# Patient Record
Sex: Female | Born: 1990 | Race: Black or African American | Hispanic: No | Marital: Single | Smoking: Former smoker
Health system: Southern US, Community
[De-identification: ages and names within clinical notes are randomized; demographics above are authoritative.]

## PROBLEM LIST (undated history)

## (undated) ENCOUNTER — Inpatient Hospital Stay (HOSPITAL_COMMUNITY): Payer: Self-pay

## (undated) DIAGNOSIS — R519 Headache, unspecified: Secondary | ICD-10-CM

## (undated) DIAGNOSIS — Z8619 Personal history of other infectious and parasitic diseases: Secondary | ICD-10-CM

## (undated) DIAGNOSIS — I499 Cardiac arrhythmia, unspecified: Secondary | ICD-10-CM

## (undated) DIAGNOSIS — D649 Anemia, unspecified: Secondary | ICD-10-CM

## (undated) DIAGNOSIS — J45909 Unspecified asthma, uncomplicated: Secondary | ICD-10-CM

## (undated) DIAGNOSIS — F419 Anxiety disorder, unspecified: Secondary | ICD-10-CM

## (undated) HISTORY — DX: Cardiac arrhythmia, unspecified: I49.9

## (undated) HISTORY — PX: NO PAST SURGERIES: SHX2092

## (undated) HISTORY — DX: Personal history of other infectious and parasitic diseases: Z86.19

## (undated) HISTORY — DX: Headache, unspecified: R51.9

## (undated) HISTORY — DX: Anxiety disorder, unspecified: F41.9

---
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
## 2010-07-24 ENCOUNTER — Emergency Department (HOSPITAL_COMMUNITY)
Admission: EM | Admit: 2010-07-24 | Discharge: 2010-07-25 | Disposition: A | Discharge status: Other Acute Inpatient Hospital | Payer: Self-pay | Source: Home / Self Care | Admitting: Emergency Medicine

## 2010-09-27 ENCOUNTER — Emergency Department (HOSPITAL_COMMUNITY)
Admission: EM | Admit: 2010-09-27 | Discharge: 2010-09-27 | Disposition: A | Discharge status: Home / Self Care | Payer: No Typology Code available for payment source | Attending: Emergency Medicine | Admitting: Emergency Medicine

## 2010-09-27 DIAGNOSIS — R079 Chest pain, unspecified: Secondary | ICD-10-CM | POA: Insufficient documentation

## 2010-09-27 DIAGNOSIS — Y92410 Unspecified street and highway as the place of occurrence of the external cause: Secondary | ICD-10-CM | POA: Insufficient documentation

## 2010-09-27 DIAGNOSIS — M25569 Pain in unspecified knee: Secondary | ICD-10-CM | POA: Insufficient documentation

## 2010-09-27 DIAGNOSIS — T1490XA Injury, unspecified, initial encounter: Secondary | ICD-10-CM | POA: Insufficient documentation

## 2010-10-04 ENCOUNTER — Emergency Department (HOSPITAL_COMMUNITY)
Admission: EM | Admit: 2010-10-04 | Discharge: 2010-10-04 | Disposition: A | Discharge status: Home / Self Care | Payer: No Typology Code available for payment source | Attending: Emergency Medicine | Admitting: Emergency Medicine

## 2010-10-04 DIAGNOSIS — M542 Cervicalgia: Secondary | ICD-10-CM | POA: Insufficient documentation

## 2010-10-04 DIAGNOSIS — M25569 Pain in unspecified knee: Secondary | ICD-10-CM | POA: Insufficient documentation

## 2010-10-26 ENCOUNTER — Emergency Department: Payer: Self-pay | Admitting: Internal Medicine

## 2010-10-29 LAB — POCT PREGNANCY, URINE: Preg Test, Ur: NEGATIVE

## 2011-01-04 ENCOUNTER — Ambulatory Visit: Payer: Self-pay | Admitting: Internal Medicine

## 2011-05-21 LAB — D-DIMER, QUANTITATIVE: D-Dimer, Quant: 0.23

## 2013-06-14 LAB — OB RESULTS CONSOLE ABO/RH: RH Type: POSITIVE

## 2013-06-14 LAB — OB RESULTS CONSOLE GC/CHLAMYDIA
Chlamydia: NEGATIVE
Gonorrhea: NEGATIVE

## 2013-06-14 LAB — OB RESULTS CONSOLE HEPATITIS B SURFACE ANTIGEN: Hepatitis B Surface Ag: NEGATIVE

## 2013-06-14 LAB — OB RESULTS CONSOLE ANTIBODY SCREEN: Antibody Screen: NEGATIVE

## 2013-06-14 LAB — OB RESULTS CONSOLE HIV ANTIBODY (ROUTINE TESTING): HIV: NONREACTIVE

## 2013-06-14 LAB — OB RESULTS CONSOLE RUBELLA ANTIBODY, IGM: Rubella: IMMUNE

## 2013-06-14 LAB — OB RESULTS CONSOLE RPR: RPR: NONREACTIVE

## 2013-08-18 NOTE — L&D Delivery Note (Signed)
Delivery Note  Cervix complete at about 0430, and pt began pushing, FHR w variable decels, overall reassuring, pt pushing well and vtx descending    At 5:03 AM a viable female was delivered via Vaginal, Spontaneous Delivery (Presentation: ; Occiput Anterior).  Shoulders delivered easily, cord around foot, APGAR: 8, 9; weight 7 lb 5.8 oz (3340 g).   Placenta status: Intact, Spontaneous.  Cord: 3 vessels with the following complications: None.  Cord pH: not collected  Cord blood collected for donation, and routine collection  Dr Raphael Gibney called to bs to evaluate for possible cervical laceration, and cervix found to be intact,   Anesthesia: Epidural  Episiotomy: None Lacerations: 2nd degree Suture Repair: 3.0 vicryl 4-0 monocryl  Est. Blood Loss (mL): 400cc   Mom to postpartum.  Baby to Couplet care / Skin to Skin. Pt plans to BF Plans outpatient circumcision   Dawn Zamora 01/07/2014, 6:04 AM

## 2013-10-19 ENCOUNTER — Other Ambulatory Visit: Payer: Self-pay

## 2013-10-19 ENCOUNTER — Inpatient Hospital Stay (HOSPITAL_COMMUNITY)
Admission: AD | Admit: 2013-10-19 | Discharge: 2013-10-19 | Disposition: A | Discharge status: Home / Self Care | Payer: Medicaid Other | Source: Ambulatory Visit | Attending: Obstetrics and Gynecology | Admitting: Obstetrics and Gynecology

## 2013-10-19 ENCOUNTER — Encounter (HOSPITAL_COMMUNITY): Payer: Self-pay | Admitting: *Deleted

## 2013-10-19 DIAGNOSIS — O99891 Other specified diseases and conditions complicating pregnancy: Secondary | ICD-10-CM | POA: Insufficient documentation

## 2013-10-19 DIAGNOSIS — J45909 Unspecified asthma, uncomplicated: Secondary | ICD-10-CM | POA: Insufficient documentation

## 2013-10-19 DIAGNOSIS — O26879 Cervical shortening, unspecified trimester: Secondary | ICD-10-CM | POA: Insufficient documentation

## 2013-10-19 DIAGNOSIS — O9989 Other specified diseases and conditions complicating pregnancy, childbirth and the puerperium: Secondary | ICD-10-CM

## 2013-10-19 HISTORY — DX: Unspecified asthma, uncomplicated: J45.909

## 2013-10-19 MED ORDER — BETAMETHASONE SOD PHOS & ACET 6 (3-3) MG/ML IJ SUSP
12.0000 mg | Freq: Once | INTRAMUSCULAR | Status: AC
  Administered 2013-10-19: 12 mg via INTRAMUSCULAR
  Filled 2013-10-19: qty 2

## 2013-10-19 NOTE — MAU Note (Signed)
Dawn Zamora is a 23 y.o. female G1P0 sent from the office for shortened cervix on ultrasound at 29+ 1 weeks. Patient reports good fetal movement. Denies any contraction, LOF or bleeding.  Ultrasound today: EFW  2 lbs  15 oz  51%, pelvic kidney x1, normal AFI, cervix=1.39 cm  Her pregnancy is remarkable for:  1. Transfer of care from [facility] at 27 weeks due to relocation 2. Shorter cervix measuring 2.3 cm for which patient has been using Prometrium 200 mg per vagina HS 3. Asthma - well-controlled  History OB History   Grav Para Term Preterm Abortions TAB SAB Ect Mult Living   3 0   1 1    0     Past Medical History  Diagnosis Date  . Asthma    Past Surgical History  Procedure Laterality Date  . No past surgeries        Blood pressure 105/62, pulse 75, resp. rate 16.  General Appearance: Alert, appropriate appearance for age. No acute distress HEENT Exam: Grossly normal Psychiatric Exam: Alert and oriented, appropriate affect  Fetal monitoring: reviewed and reassuring Category 1. No contractions  ++++++++++++++++++++++++++++++++++++++++++++++++++++++++++++++++  Vaginal exam: deferred  ++++++++++++++++++++++++++++++++++++++++++++++++++++++++++++++++  Assessment and plan:  29+1 weeks with shortened cervix BMZ #1 received No evidence of preterm labor D/C home and return tomorrow for BMZ #2 PTL precautions given. Bed rest recommended and explained.  Next appointment in office: 1 week with repeat ultrasound  Delsa Bern MD

## 2013-10-19 NOTE — Discharge Instructions (Signed)
Keep your scheduled appointment for prenatal care. Return to MAU tomorrow for second injection. Dr. Cletis Media advises that you do not go to [place] this weekend and that you remain on bedrest.

## 2013-10-19 NOTE — MAU Note (Signed)
Waiting on Betamethasone from pharmcy- Dr Cletis Media called and wanted pt to be monitored

## 2013-10-20 ENCOUNTER — Inpatient Hospital Stay (HOSPITAL_COMMUNITY)
Admission: AD | Admit: 2013-10-20 | Discharge: 2013-10-20 | Disposition: A | Discharge status: Home / Self Care | Payer: Medicaid Other | Source: Ambulatory Visit | Attending: Obstetrics and Gynecology | Admitting: Obstetrics and Gynecology

## 2013-10-20 DIAGNOSIS — O4700 False labor before 37 completed weeks of gestation, unspecified trimester: Secondary | ICD-10-CM | POA: Insufficient documentation

## 2013-10-20 MED ORDER — BETAMETHASONE SOD PHOS & ACET 6 (3-3) MG/ML IJ SUSP
12.0000 mg | Freq: Once | INTRAMUSCULAR | Status: AC
  Administered 2013-10-20: 12 mg via INTRAMUSCULAR
  Filled 2013-10-20: qty 2

## 2013-12-08 LAB — OB RESULTS CONSOLE GBS: GBS: POSITIVE

## 2014-01-06 ENCOUNTER — Telehealth (HOSPITAL_COMMUNITY): Payer: Self-pay | Admitting: *Deleted

## 2014-01-06 ENCOUNTER — Inpatient Hospital Stay (HOSPITAL_COMMUNITY)
Admission: AD | Admit: 2014-01-06 | Discharge: 2014-01-09 | DRG: 775 | Disposition: A | Discharge status: Home / Self Care | Payer: Medicaid Other | Source: Ambulatory Visit | Attending: Obstetrics and Gynecology | Admitting: Obstetrics and Gynecology

## 2014-01-06 ENCOUNTER — Encounter (HOSPITAL_COMMUNITY): Payer: Self-pay | Admitting: *Deleted

## 2014-01-06 DIAGNOSIS — F411 Generalized anxiety disorder: Secondary | ICD-10-CM | POA: Diagnosis present

## 2014-01-06 DIAGNOSIS — D649 Anemia, unspecified: Secondary | ICD-10-CM | POA: Diagnosis present

## 2014-01-06 DIAGNOSIS — Z87891 Personal history of nicotine dependence: Secondary | ICD-10-CM

## 2014-01-06 DIAGNOSIS — Z22330 Carrier of Group B streptococcus: Secondary | ICD-10-CM

## 2014-01-06 DIAGNOSIS — J45909 Unspecified asthma, uncomplicated: Secondary | ICD-10-CM | POA: Diagnosis present

## 2014-01-06 DIAGNOSIS — O26879 Cervical shortening, unspecified trimester: Secondary | ICD-10-CM | POA: Diagnosis present

## 2014-01-06 DIAGNOSIS — Z833 Family history of diabetes mellitus: Secondary | ICD-10-CM

## 2014-01-06 DIAGNOSIS — O4290 Premature rupture of membranes, unspecified as to length of time between rupture and onset of labor, unspecified weeks of gestation: Principal | ICD-10-CM | POA: Diagnosis present

## 2014-01-06 DIAGNOSIS — O99344 Other mental disorders complicating childbirth: Secondary | ICD-10-CM | POA: Diagnosis present

## 2014-01-06 DIAGNOSIS — O9989 Other specified diseases and conditions complicating pregnancy, childbirth and the puerperium: Secondary | ICD-10-CM

## 2014-01-06 DIAGNOSIS — O99892 Other specified diseases and conditions complicating childbirth: Secondary | ICD-10-CM | POA: Diagnosis present

## 2014-01-06 DIAGNOSIS — O9902 Anemia complicating childbirth: Secondary | ICD-10-CM | POA: Diagnosis present

## 2014-01-06 LAB — POCT FERN TEST: POCT Fern Test: POSITIVE

## 2014-01-06 LAB — RPR

## 2014-01-06 LAB — CBC
HCT: 34.2 % — ABNORMAL LOW (ref 36.0–46.0)
Hemoglobin: 11.5 g/dL — ABNORMAL LOW (ref 12.0–15.0)
MCH: 27.7 pg (ref 26.0–34.0)
MCHC: 33.6 g/dL (ref 30.0–36.0)
MCV: 82.4 fL (ref 78.0–100.0)
Platelets: 281 10*3/uL (ref 150–400)
RBC: 4.15 MIL/uL (ref 3.87–5.11)
RDW: 13.6 % (ref 11.5–15.5)
WBC: 9.3 10*3/uL (ref 4.0–10.5)

## 2014-01-06 MED ORDER — OXYTOCIN BOLUS FROM INFUSION
500.0000 mL | INTRAVENOUS | Status: DC
  Administered 2014-01-07: 500 mL via INTRAVENOUS

## 2014-01-06 MED ORDER — LIDOCAINE HCL (PF) 1 % IJ SOLN
30.0000 mL | INTRAMUSCULAR | Status: DC | PRN
  Filled 2014-01-06: qty 30

## 2014-01-06 MED ORDER — CITRIC ACID-SODIUM CITRATE 334-500 MG/5ML PO SOLN: 30.0000 mL | ORAL | Status: DC | PRN

## 2014-01-06 MED ORDER — LACTATED RINGERS IV SOLN
INTRAVENOUS | Status: DC
  Administered 2014-01-06 – 2014-01-07 (×2): via INTRAVENOUS

## 2014-01-06 MED ORDER — OXYTOCIN 40 UNITS IN LACTATED RINGERS INFUSION - SIMPLE MED
1.0000 m[IU]/min | INTRAVENOUS | Status: DC
  Administered 2014-01-06: 1 m[IU]/min via INTRAVENOUS
  Filled 2014-01-06: qty 1000

## 2014-01-06 MED ORDER — NALBUPHINE HCL 10 MG/ML IJ SOLN
10.0000 mg | INTRAMUSCULAR | Status: DC | PRN
  Administered 2014-01-07: 10 mg via INTRAVENOUS
  Filled 2014-01-06: qty 1

## 2014-01-06 MED ORDER — OXYCODONE-ACETAMINOPHEN 5-325 MG PO TABS: 1.0000 | ORAL_TABLET | ORAL | Status: DC | PRN

## 2014-01-06 MED ORDER — ACETAMINOPHEN 325 MG PO TABS: 650.0000 mg | ORAL_TABLET | ORAL | Status: DC | PRN

## 2014-01-06 MED ORDER — IBUPROFEN 600 MG PO TABS
600.0000 mg | ORAL_TABLET | Freq: Four times a day (QID) | ORAL | Status: DC | PRN
  Administered 2014-01-07: 600 mg via ORAL
  Filled 2014-01-06: qty 1

## 2014-01-06 MED ORDER — FLEET ENEMA 7-19 GM/118ML RE ENEM: 1.0000 | ENEMA | RECTAL | Status: DC | PRN

## 2014-01-06 MED ORDER — OXYTOCIN 40 UNITS IN LACTATED RINGERS INFUSION - SIMPLE MED
62.5000 mL/h | INTRAVENOUS | Status: DC
Start: 2014-01-06 — End: 2014-01-07

## 2014-01-06 MED ORDER — ONDANSETRON HCL 4 MG/2ML IJ SOLN
4.0000 mg | Freq: Four times a day (QID) | INTRAMUSCULAR | Status: DC | PRN
  Administered 2014-01-07: 4 mg via INTRAVENOUS
  Filled 2014-01-06: qty 2

## 2014-01-06 MED ORDER — LACTATED RINGERS IV SOLN: 500.0000 mL | INTRAVENOUS | Status: DC | PRN

## 2014-01-06 MED ORDER — DEXTROSE 5 % IV SOLN
5.0000 10*6.[IU] | Freq: Once | INTRAVENOUS | Status: AC
  Administered 2014-01-06: 5 10*6.[IU] via INTRAVENOUS
  Filled 2014-01-06: qty 5

## 2014-01-06 MED ORDER — TERBUTALINE SULFATE 1 MG/ML IJ SOLN: 0.2500 mg | Freq: Once | INTRAMUSCULAR | Status: AC | PRN

## 2014-01-06 MED ORDER — PENICILLIN G POTASSIUM 5000000 UNITS IJ SOLR
2.5000 10*6.[IU] | INTRAMUSCULAR | Status: DC
  Administered 2014-01-06 – 2014-01-07 (×2): 2.5 10*6.[IU] via INTRAVENOUS
  Filled 2014-01-06 (×5): qty 2.5

## 2014-01-06 NOTE — MAU Note (Signed)
Patient states she has been leaking small amounts of clear fluid since 1000 this am. Denies contractions. Reports good fetal movement.

## 2014-01-06 NOTE — Progress Notes (Signed)
Patient ID: Dawn Zamora, female   DOB: March 18, 1991, 23 y.o.   MRN: [id number] Dawn Zamora is a 23 y.o. G2P0010 at [redacted]w[redacted]d admitted for PROM  Subjective: occ feeling ctx, denies pain  Objective: BP 108/70  Pulse 100  Temp(Src) 97.7 F (36.5 C) (Oral)  Resp 16  Ht 5\' 4"  (1.626 m)  Wt 146 lb (66.225 kg)  BMI 25.05 kg/m2  SpO2 99%     FHT:  Cat 1 UC:   toco 3-4   SVE:   Dilation: 3 Effacement (%): 90 Station: 0 Exam by:: Darvin Dials CNM  Exam deferred   Assessment / Plan:  Labor: prom,  Preeclampsia:  no s/s Fetal Wellbeing:  Category I Pain Control:  Labor support without medications Anticipated MOD:  NSVD  GBS pos, rcv'd PCN Pitocin at 75mu, continue titration,   Update physician PRN   Lamar Blinks Jennett Tarbell 01/06/2014, 11:16 PM

## 2014-01-06 NOTE — Progress Notes (Signed)
Patient ID: Dawn Zamora, female   DOB: [birth year], 23 y.o.   MRN: [id number] Dawn Zamora is a 23 y.o. G2P0010 at 110w3d admitted for PROM  Subjective: No c/o, denies any pain, mild bloody show   Objective: BP 118/66  Pulse 74  Temp(Src) 98.2 F (36.8 C) (Oral)  Resp 16  Ht 5\' 4"  (1.626 m)  Wt 146 lb (66.225 kg)  BMI 25.05 kg/m2  SpO2 99%     FHT:  Cat 1 UC:   toco rare  SVE:   Dilation: 3 Effacement (%): 90 Station: 0 Exam by:: Dawn Zamora CNM  Mod amt bloody show  Assessment / Plan:  Labor: PROM, w unknown time of rupture Preeclampsia:  no s/s Fetal Wellbeing:  Category I Pain Control:  Labor support without medications Anticipated MOD:  NSVD  GBS pos, rcv'd PCN Will begin pitocin augmentation, titrate prn, IUPC prn, pain meds prn   Update physician PRN   Dawn Zamora Dawn Zamora 01/06/2014, 8:08 PM

## 2014-01-06 NOTE — H&P (Signed)
Dawn Zamora is a 23 y.o. female presenting for leaking of fluid. States woke up this morning and noted that her underpants were saturated. Placed pad and in less than 30 minutes, pad was damp but not saturated. States fluid was clear and w/o odor. Denies VB. Reports active fetus, rare and non-painful ctxs.   Antepartum course  1) asthma; controlled w/ prn inhaler, 2) shortened cervix s/p Prometrium and betamethasone, 3) palpitations 2/2 anxiety and 4) anemia - taking Iron daily  Fetal anatomic scan notable for left pelvic kidney  Maternal Medical History:  Reason for admission: Rupture of membranes and contractions.   Contractions: Onset was 6-12 hours ago.   Frequency: rare.   Duration is approximately 30 seconds.   Perceived severity is mild.    Fetal activity: Perceived fetal activity is normal.   Last perceived fetal movement was within the past hour.    Prenatal Complications - Diabetes: none.    OB History   Grav Para Term Preterm Abortions TAB SAB Ect Mult Living   2 0   1 1    0     Past Medical History  Diagnosis Date  . Asthma   . Dysrhythmia   . Hx of chlamydia infection    Past Surgical History  Procedure Laterality Date  . No past surgeries     Family History: family history includes Diabetes in her maternal aunt, maternal grandfather, maternal grandmother, and mother; Polydactyly in her paternal uncle; Sickle cell anemia in her cousin. Social History:  reports that she quit smoking about 3 years ago. She has never used smokeless tobacco. She reports that she drinks alcohol. She reports that she does not use illicit drugs.  FOB: Tacey Heap Works at [employer]; bedrest since 29 wks [name]  Prenatal Transfer Tool  Maternal Diabetes: No Genetic Screening: Normal Maternal Ultrasounds/Referrals: Cervical length scans Fetal Ultrasounds or other Referrals:  Left pelvic kidney on anatomic scan Maternal Substance Abuse:  Denies Significant Maternal  Medications:  PNVs, Prometrium Significant Maternal Lab Results:  Lab values include: Group B Strep positive   ROS  +FM +ctxs  Dilation: 3 Exam by:: K.Rouser,CNM Blood pressure 118/66, pulse 74, temperature 98.2 F (36.8 C), temperature source Oral, resp. rate 16, height 5\' 4"  (1.626 m), weight 146 lb (66.225 kg), SpO2 99.00%.  FHRT: Cat 1 Toco: Irregular ctxs  Maternal Exam:  Uterine Assessment: Contraction strength is mild.  Contraction duration is 30 seconds. Contraction frequency is irregular.   Abdomen: Fundal height is Consistent w/ dates.   Estimated fetal weight is 6 3/4 lbs.   Fetal presentation: vertex  Introitus: Normal vulva. Normal vagina.  Ferning test: positive.  Amniotic fluid character: clear.  Pelvis: adequate for delivery.   Cervix: Cervix evaluated by digital exam.     Physical Exam  Constitutional: She is oriented to person, place, and time. She appears well-developed and well-nourished.  Cardiovascular: Normal rate and regular rhythm.   Respiratory: Effort normal and breath sounds normal.  GI: Soft. There is no tenderness.  Genitourinary: Vagina normal and uterus normal.  Musculoskeletal: She exhibits no edema.  Neurological: She is alert and oriented to person, place, and time. She has normal reflexes.  Skin: Skin is warm and dry.  Psychiatric: She has a normal mood and affect.    Prenatal labs: ABO, Rh: AB/Positive/-- (10/28 0000) Antibody: Negative (10/28 0000) Rubella: Immune (10/28 0000) RPR: Nonreactive (10/28 0000)  HBsAg: Negative (10/28 0000)  HIV: Non-reactive (10/28 0000)  GBS: Positive (04/23 0000)  SSE: +pooling, +valsalva, +fern Cervix: 7/62/2, cephalic by Le[place] +bloody show after cervical exam  Assessment: IUP at [redacted]w[redacted]d SROM GBS positive  Plan: Admit to BS Routine L&D orders PCN prophylaxis Pitocin augmentation as needed IV pain meds and/or Epidural as desired Notify peds about presumed left pelvic  kidney Expect progress and SVD   Itzamar Traynor 01/06/2014, 6:57 PM

## 2014-01-06 NOTE — Telephone Encounter (Signed)
Preadmission screen  

## 2014-01-07 ENCOUNTER — Inpatient Hospital Stay (HOSPITAL_COMMUNITY): Payer: Medicaid Other | Admitting: Anesthesiology

## 2014-01-07 ENCOUNTER — Encounter (HOSPITAL_COMMUNITY): Payer: Medicaid Other | Admitting: Anesthesiology

## 2014-01-07 ENCOUNTER — Encounter (HOSPITAL_COMMUNITY): Payer: Self-pay

## 2014-01-07 MED ORDER — PRENATAL MULTIVITAMIN CH
1.0000 | ORAL_TABLET | Freq: Every day | ORAL | Status: DC
  Administered 2014-01-07 – 2014-01-08 (×2): 1 via ORAL
  Filled 2014-01-07 (×2): qty 1

## 2014-01-07 MED ORDER — ONDANSETRON HCL 4 MG/2ML IJ SOLN
4.0000 mg | INTRAMUSCULAR | Status: DC | PRN
Start: 2014-01-07 — End: 2014-01-09

## 2014-01-07 MED ORDER — OXYCODONE-ACETAMINOPHEN 5-325 MG PO TABS
1.0000 | ORAL_TABLET | ORAL | Status: DC | PRN
  Administered 2014-01-07: 1 via ORAL
  Filled 2014-01-07: qty 1

## 2014-01-07 MED ORDER — LIDOCAINE-EPINEPHRINE (PF) 2 %-1:200000 IJ SOLN
INTRAMUSCULAR | Status: DC | PRN
  Administered 2014-01-07: 5 mL via EPIDURAL

## 2014-01-07 MED ORDER — DIPHENHYDRAMINE HCL 50 MG/ML IJ SOLN: 12.5000 mg | INTRAMUSCULAR | Status: DC | PRN

## 2014-01-07 MED ORDER — DIPHENHYDRAMINE HCL 25 MG PO CAPS: 25.0000 mg | ORAL_CAPSULE | Freq: Four times a day (QID) | ORAL | Status: DC | PRN

## 2014-01-07 MED ORDER — PHENYLEPHRINE 40 MCG/ML (10ML) SYRINGE FOR IV PUSH (FOR BLOOD PRESSURE SUPPORT)
80.0000 ug | PREFILLED_SYRINGE | INTRAVENOUS | Status: DC | PRN
Start: 2014-01-07 — End: 2014-01-07
  Filled 2014-01-07: qty 2

## 2014-01-07 MED ORDER — LANOLIN HYDROUS EX OINT
TOPICAL_OINTMENT | CUTANEOUS | Status: DC | PRN
Start: 2014-01-07 — End: 2014-01-09

## 2014-01-07 MED ORDER — BENZOCAINE-MENTHOL 20-0.5 % EX AERO
1.0000 "application " | INHALATION_SPRAY | CUTANEOUS | Status: DC | PRN
  Administered 2014-01-07: 1 via TOPICAL
  Filled 2014-01-07: qty 56

## 2014-01-07 MED ORDER — ONDANSETRON HCL 4 MG PO TABS: 4.0000 mg | ORAL_TABLET | ORAL | Status: DC | PRN

## 2014-01-07 MED ORDER — EPHEDRINE 5 MG/ML INJ
10.0000 mg | INTRAVENOUS | Status: DC | PRN
Start: 2014-01-07 — End: 2014-01-07
  Filled 2014-01-07: qty 2
  Filled 2014-01-07: qty 4

## 2014-01-07 MED ORDER — DIBUCAINE 1 % RE OINT
1.0000 | TOPICAL_OINTMENT | RECTAL | Status: DC | PRN
Start: 2014-01-07 — End: 2014-01-09

## 2014-01-07 MED ORDER — EPHEDRINE 5 MG/ML INJ
10.0000 mg | INTRAVENOUS | Status: DC | PRN
  Filled 2014-01-07: qty 2

## 2014-01-07 MED ORDER — PHENYLEPHRINE 40 MCG/ML (10ML) SYRINGE FOR IV PUSH (FOR BLOOD PRESSURE SUPPORT)
80.0000 ug | PREFILLED_SYRINGE | INTRAVENOUS | Status: DC | PRN
  Filled 2014-01-07: qty 10
  Filled 2014-01-07: qty 2

## 2014-01-07 MED ORDER — SENNOSIDES-DOCUSATE SODIUM 8.6-50 MG PO TABS
2.0000 | ORAL_TABLET | ORAL | Status: DC
  Administered 2014-01-08 (×2): 2 via ORAL
  Filled 2014-01-07 (×2): qty 2

## 2014-01-07 MED ORDER — SIMETHICONE 80 MG PO CHEW
80.0000 mg | CHEWABLE_TABLET | ORAL | Status: DC | PRN
Start: 2014-01-07 — End: 2014-01-09

## 2014-01-07 MED ORDER — FENTANYL 2.5 MCG/ML BUPIVACAINE 1/10 % EPIDURAL INFUSION (WH - ANES)
14.0000 mL/h | INTRAMUSCULAR | Status: DC | PRN
  Filled 2014-01-07: qty 125

## 2014-01-07 MED ORDER — LACTATED RINGERS IV SOLN
500.0000 mL | Freq: Once | INTRAVENOUS | Status: AC
  Administered 2014-01-07: 500 mL via INTRAVENOUS

## 2014-01-07 MED ORDER — FENTANYL 2.5 MCG/ML BUPIVACAINE 1/10 % EPIDURAL INFUSION (WH - ANES)
14.0000 mL/h | INTRAMUSCULAR | Status: DC | PRN
  Administered 2014-01-07: 14 mL/h via EPIDURAL

## 2014-01-07 MED ORDER — IBUPROFEN 600 MG PO TABS
600.0000 mg | ORAL_TABLET | Freq: Four times a day (QID) | ORAL | Status: DC
  Administered 2014-01-08 – 2014-01-09 (×6): 600 mg via ORAL
  Filled 2014-01-07 (×6): qty 1

## 2014-01-07 MED ORDER — TETANUS-DIPHTH-ACELL PERTUSSIS 5-2.5-18.5 LF-MCG/0.5 IM SUSP
0.5000 mL | Freq: Once | INTRAMUSCULAR | Status: AC
  Administered 2014-01-08: 0.5 mL via INTRAMUSCULAR
  Filled 2014-01-07: qty 0.5

## 2014-01-07 MED ORDER — WITCH HAZEL-GLYCERIN EX PADS: 1.0000 "application " | MEDICATED_PAD | CUTANEOUS | Status: DC | PRN

## 2014-01-07 MED ORDER — FERROUS SULFATE 325 (65 FE) MG PO TABS
325.0000 mg | ORAL_TABLET | Freq: Two times a day (BID) | ORAL | Status: DC
  Administered 2014-01-07 – 2014-01-08 (×3): 325 mg via ORAL
  Filled 2014-01-07 (×3): qty 1

## 2014-01-07 MED ORDER — ZOLPIDEM TARTRATE 5 MG PO TABS: 5.0000 mg | ORAL_TABLET | Freq: Every evening | ORAL | Status: DC | PRN

## 2014-01-07 NOTE — Anesthesia Procedure Notes (Signed)

## 2014-01-07 NOTE — Progress Notes (Signed)
Patient ID: Dawn Zamora, female   DOB: 09-19-1990, 23 y.o.   MRN: [id number] Dawn Zamora is a 23 y.o. G2P0010 at [redacted]w[redacted]d admitted for labor  Subjective: Getting comfortable w epidural, sleepy   Objective: BP 104/51  Pulse 79  Temp(Src) 98.1 F (36.7 C) (Oral)  Resp 18  Ht 5\' 4"  (1.626 m)  Wt 146 lb (66.225 kg)  BMI 25.05 kg/m2  SpO2 100%     FHT:  Cat 2, early variables UC:   toco 2-3   SVE:   9/100/0    Assessment / Plan:  Labor: Progressing normally Preeclampsia:  no s/s Fetal Wellbeing:  Category II Pain Control:  Epidural Anticipated MOD:  NSVD  GBS pos, rcv'd PCN Labor down, recheck w urge to push    Update physician PRN   Lamar Blinks Dawn Zamora 01/07/2014, 4:21 AM

## 2014-01-07 NOTE — Progress Notes (Signed)
Patient ID: Dawn Zamora, female   DOB: [birth year], 23 y.o.   MRN: [id number] Dawn Zamora is a 23 y.o. G2P0010 at [redacted]w[redacted]d admitted for PROM  Subjective: Breathing w ctx, states feeling more regular painful ctx, denies need for pain meds at present  Objective: BP 125/56  Pulse 79  Temp(Src) 98.1 F (36.7 C) (Oral)  Resp 20  Ht 5\' 4"  (1.626 m)  Wt 146 lb (66.225 kg)  BMI 25.05 kg/m2  SpO2 99%     FHT:  Cat 1 UC:   toco 2-3  SVE:   Dilation: 3 Effacement (%): 90 Station: 0 Exam by:: Dawn Zamora CNM  Exam deferred    Assessment / Plan:  Labor: early labor, PROM Preeclampsia:  no s/s Fetal Wellbeing:  Category I Pain Control:  Labor support without medications Anticipated MOD:  NSVD  GBS pos, rcv'd PCN Continue pitocin titration Pain meds prn  IUPC prn    Update physician PRN   Dawn Zamora Dawn Zamora 01/07/2014, 1:12 AM

## 2014-01-07 NOTE — Lactation Note (Signed)
This note was copied from the chart of [place]. Lactation Consultation Note  Patient Name: Dawn Zamora OACZY'S Date: 01/07/2014 Reason for consult: Initial assessment Baby 13 hours of life. Mom reports that baby has fed twice since birth, but has been too sleepy to latch all other times breast was offered. Demonstrated waking techniques and assisted mom to latch baby. Baby would had a couple of bursts of sucking but would then stop and sleep. Assisted mom to hand express a few drops of colostrum from her left breast and fed baby with spoon. Mom able to hand express, but not much colostrum so gave mom a hand pump with instructions. Assisted mom to use hand pump on right breast and breast immediately started dripping colostrum. Enc mom to pump each breast 10 minutes and collect EBM to give at next feed. Plan is to massage breasts and hand express to get colostrum flowing, then attempt to latch baby directly to breast first, then follow-up with hand expression and give baby EBM as supplement. Baby voided well for first time while latching and supplementing. Gave mom [place] brochure, and mom aware of OP/BFSG and community resources. Enc mom to call out for assistance as needed and consulted with patient's MBU nurse regarding assessment and BF plan.  Maternal Data Infant to breast within first hour of birth: Yes Has patient been taught Hand Expression?: Yes Does the patient have breastfeeding experience prior to this delivery?: No  Feeding Feeding Type: Breast Fed (Baby sleepy at breast, would not latch, took 5 minutes for baby to suck LC's gloved finger. Would only suck vigorously for a few seconds. Gave a few drops of EBM with spoon.) Length of feed: 0 min  LATCH Score/Interventions Latch: Too sleepy or reluctant, no latch achieved, no sucking elicited.  Audible Swallowing: None Intervention(s): Skin to skin;Hand expression  Type of Nipple: Everted at rest and after  stimulation  Comfort (Breast/Nipple): Soft / non-tender     Hold (Positioning): Assistance needed to correctly position infant at breast and maintain latch. Intervention(s): Breastfeeding basics reviewed;Support Pillows;Position options  LATCH Score: 5  Lactation Tools Discussed/Used     Consult Status Consult Status: Follow-up Follow-up type: In-patient    Andres Labrum 01/07/2014, 6:48 PM

## 2014-01-07 NOTE — Anesthesia Preprocedure Evaluation (Signed)
Anesthesia Evaluation  Patient identified by MRN, date of birth, ID band Patient awake    Reviewed: Allergy & Precautions, H&P , Patient's Chart, lab work & pertinent test results  Airway Mallampati: II TM Distance: >3 FB Neck ROM: full    Dental  (+) Teeth Intact   Pulmonary asthma , former smoker,  breath sounds clear to auscultation        Cardiovascular Rhythm:regular Rate:Normal     Neuro/Psych    GI/Hepatic   Endo/Other    Renal/GU      Musculoskeletal   Abdominal   Peds  Hematology   Anesthesia Other Findings       Reproductive/Obstetrics (+) Pregnancy                           Anesthesia Physical Anesthesia Plan  ASA: II  Anesthesia Plan: Epidural   Post-op Pain Management:    Induction:   Airway Management Planned:   Additional Equipment:   Intra-op Plan:   Post-operative Plan:   Informed Consent: I have reviewed the patients History and Physical, chart, labs and discussed the procedure including the risks, benefits and alternatives for the proposed anesthesia with the patient or authorized representative who has indicated his/her understanding and acceptance.   Dental Advisory Given  Plan Discussed with:   Anesthesia Plan Comments: (Labs checked- platelets confirmed with RN in room. Fetal heart tracing, per RN, reported to be stable enough for sitting procedure. Discussed epidural, and patient consents to the procedure:  included risk of possible headache,backache, failed block, allergic reaction, and nerve injury. This patient was asked if she had any questions or concerns before the procedure started.)        Anesthesia Quick Evaluation

## 2014-01-08 LAB — CBC
HCT: 24 % — ABNORMAL LOW (ref 36.0–46.0)
Hemoglobin: 8 g/dL — ABNORMAL LOW (ref 12.0–15.0)
MCH: 27.4 pg (ref 26.0–34.0)
MCHC: 33.3 g/dL (ref 30.0–36.0)
MCV: 82.2 fL (ref 78.0–100.0)
Platelets: 244 10*3/uL (ref 150–400)
RBC: 2.92 MIL/uL — ABNORMAL LOW (ref 3.87–5.11)
RDW: 13.6 % (ref 11.5–15.5)
WBC: 9.5 10*3/uL (ref 4.0–10.5)

## 2014-01-08 NOTE — Progress Notes (Signed)
Dawn Zamora  Post Partum Day 1: S/P SVD with a 2nd degree laceration  Subjective: Patient up ad lib, denies syncope or dizziness.  Voiding without difficulty, pain well manage. Feeding:  Breastfeeding Contraceptive plan:   Undecided at this time  Objective: Blood pressure 105/64, pulse 77, temperature 98.4 F (36.9 C), temperature source Oral, resp. rate 18, height 5\' 4"  (1.626 m), weight 146 lb (66.225 kg), SpO2 100.00%, unknown if currently breastfeeding.  Physical Exam:  General: alert, cooperative and no distress Lochia: appropriate Uterine Fundus: firm Incision: healing well DVT Evaluation: No evidence of DVT seen on physical exam. Negative Homan's sign.   Recent Labs  01/06/14 1840 01/08/14 0545  HGB 11.5* 8.0*  HCT 34.2* 24.0*    A/P S/P Vaginal delivery day 1 Asymptomatic Anemia - Fe ordered yesterday Continue current care Plan for discharge tomorrow     Linda Hedges, CNM 01/08/2014, 8:33 AM

## 2014-01-08 NOTE — Discharge Summary (Signed)
Vaginal Delivery Discharge Summary  Dawn Zamora  DOB:    05-Jan-1991 MRN:    [id number] CSN:    [id number]  Date of admission:                  01/06/14  Date of discharge:                   01/09/14  Procedures this admission: SVD with 2nd degree laceration  Date of Delivery: 01/07/14 by Arville Go  Newborn Data:  Live born female  Birth Weight: 7 lb 5.8 oz (3340 g) APGAR: 8, 9  Home with mother. Name: "Dawn Zamora" Circumcision Plan: Office  History of Present Illness:  Dawn Zamora is a 23 y.o. female, G2P1011, who presents at [redacted]w[redacted]d weeks gestation. The patient has been followed at the [facility] division of [facility]. She was admitted rupture of membranes. Her pregnancy has been complicated by: None  Hospital course:  The patient was admitted for PROM vs early labor.   Her labor was not complicated. She proceeded to have a vaginal delivery of a healthy infant. Her delivery was not complicated. Her postpartum course was not complicated. Hgb 8.0, asymptomatic anemia.  She was discharged to home on postpartum day 2 doing well.  Feeding:  breast  Contraception:  undecided  Discharge hemoglobin:  Hemoglobin  Date Value Ref Range Status  01/08/2014 8.0* 12.0 - 15.0 g/dL Final     REPEATED TO VERIFY     DELTA CHECK NOTED     HCT  Date Value Ref Range Status  01/08/2014 24.0* 36.0 - 46.0 % Final    Discharge Physical Exam:   General: alert, cooperative and no distress Lochia: appropriate Uterine Fundus: firm Incision: healing well DVT Evaluation: No evidence of DVT seen on physical exam. Negative Homan's sign.  Intrapartum Procedures: spontaneous vaginal delivery and GBS prophylaxis Postpartum Procedures: none Complications-Operative and Postpartum: 2nd degree perineal laceration  Discharge Diagnoses: Term Pregnancy-delivered  Discharge Information:  Activity:           pelvic rest Diet:                 routine Medications: PNV, Ibuprofen, Iron and Percocet Condition:      stable Instructions:   Postpartum Care After Vaginal Delivery  After you deliver your newborn (postpartum period), the usual stay in the hospital is 24 72 hours. If there were problems with your labor or delivery, or if you have other medical problems, you might be in the hospital longer.  While you are in the hospital, you will receive help and instructions on how to care for yourself and your newborn during the postpartum period.  While you are in the hospital:  Be sure to tell your nurses if you have pain or discomfort, as well as where you feel the pain and what makes the pain worse.  If you had an incision made near your vagina (episiotomy) or if you had some tearing during delivery, the nurses may put ice packs on your episiotomy or tear. The ice packs may help to reduce the pain and swelling.  If you are breastfeeding, you may feel uncomfortable contractions of your uterus for a couple of weeks. This is normal. The contractions help your uterus get back to normal size.  It is normal to have some bleeding after delivery.  For the first 1 3 days after delivery, the flow is red and the amount may be similar  to a period.  It is common for the flow to start and stop.  In the first few days, you may pass some small clots. Let your nurses know if you begin to pass large clots or your flow increases.  Do not  flush blood clots down the toilet before having the nurse look at them.  During the next 3 10 days after delivery, your flow should become more watery and pink or brown-tinged in color.  Ten to fourteen days after delivery, your flow should be a small amount of yellowish-white discharge.  The amount of your flow will decrease over the first few weeks after delivery. Your flow may stop in 6 8 weeks. Most women have had their flow stop by 12 weeks after delivery.  You should change your sanitary pads  frequently.  Wash your hands thoroughly with soap and water for at least 20 seconds after changing pads, using the toilet, or before holding or feeding your newborn.  You should feel like you need to empty your bladder within the first 6 8 hours after delivery.  In case you become weak, lightheaded, or faint, call your nurse before you get out of bed for the first time and before you take a shower for the first time.  Within the first few days after delivery, your breasts may begin to feel tender and full. This is called engorgement. Breast tenderness usually goes away within 48 72 hours after engorgement occurs. You may also notice milk leaking from your breasts. If you are not breastfeeding, do not stimulate your breasts. Breast stimulation can make your breasts produce more milk.  Spending as much time as possible with your newborn is very important. During this time, you and your newborn can feel close and get to know each other. Having your newborn stay in your room (rooming in) will help to strengthen the bond with your newborn. It will give you time to get to know your newborn and become comfortable caring for your newborn.  Your hormones change after delivery. Sometimes the hormone changes can temporarily cause you to feel sad or tearful. These feelings should not last more than a few days. If these feelings last longer than that, you should talk to your caregiver.  If desired, talk to your caregiver about methods of family planning or contraception.  Talk to your caregiver about immunizations. Your caregiver may want you to have the following immunizations before leaving the hospital:  Tetanus, diphtheria, and pertussis (Tdap) or tetanus and diphtheria (Td) immunization. It is very important that you and your family (including grandparents) or others caring for your newborn are up-to-date with the Tdap or Td immunizations. The Tdap or Td immunization can help protect your newborn from  getting ill.  Rubella immunization.  Varicella (chickenpox) immunization.  Influenza immunization. You should receive this annual immunization if you did not receive the immunization during your pregnancy. Document Released: 06/01/2007 Document Revised: 04/28/2012 Document Reviewed: 03/31/2012 [facility] Patient Information 2014 [place].   Postpartum Depression and Baby Blues  The postpartum period begins right after the birth of a baby. During this time, there is often a great amount of joy and excitement. It is also a time of considerable changes in the life of the parent(s). Regardless of how many times a mother gives birth, each child brings new challenges and dynamics to the family. It is not unusual to have feelings of excitement accompanied by confusing shifts in moods, emotions, and thoughts. All mothers are at  risk of developing postpartum depression or the "baby blues." These mood changes can occur right after giving birth, or they may occur many months after giving birth. The baby blues or postpartum depression can be mild or severe. Additionally, postpartum depression can resolve rather quickly, or it can be a long-term condition. CAUSES Elevated hormones and their rapid decline are thought to be a main cause of postpartum depression and the baby blues. There are a number of hormones that radically change during and after pregnancy. Estrogen and progesterone usually decrease immediately after delivering your baby. The level of thyroid hormone and various cortisol steroids also rapidly drop. Other factors that play a major role in these changes include major life events and genetics.  RISK FACTORS If you have any of the following risks for the baby blues or postpartum depression, know what symptoms to watch out for during the postpartum period. Risk factors that may increase the likelihood of getting the baby blues or postpartum depression include:  Havinga personal or family  history of depression.  Having depression while being pregnant.  Having premenstrual or oral contraceptive-associated mood issues.  Having exceptional life stress.  Having marital conflict.  Lacking a social support network.  Having a baby with special needs.  Having health problems such as diabetes. SYMPTOMS Baby blues symptoms include:  Brief fluctuations in mood, such as going from extreme happiness to sadness.  Decreased concentration.  Difficulty sleeping.  Crying spells, tearfulness.  Irritability.  Anxiety. Postpartum depression symptoms typically begin within the first month after giving birth. These symptoms include:  Difficulty sleeping or excessive sleepiness.  Marked weight loss.  Agitation.  Feelings of worthlessness.  Lack of interest in activity or food. Postpartum psychosis is a very concerning condition and can be dangerous. Fortunately, it is rare. Displaying any of the following symptoms is cause for immediate medical attention. Postpartum psychosis symptoms include:  Hallucinations and delusions.  Bizarre or disorganized behavior.  Confusion or disorientation. DIAGNOSIS  A diagnosis is made by an evaluation of your symptoms. There are no medical or lab tests that lead to a diagnosis, but there are various questionnaires that a caregiver may use to identify those with the baby blues, postpartum depression, or psychosis. Often times, a screening tool called the [place] Postnatal Depression Scale is used to diagnose depression in the postpartum period.  TREATMENT The baby blues usually goes away on its own in 1 to 2 weeks. Social support is often all that is needed. You should be encouraged to get adequate sleep and rest. Occasionally, you may be given medicines to help you sleep.  Postpartum depression requires treatment as it can last several months or longer if it is not treated. Treatment may include individual or group therapy, medicine, or  both to address any social, physiological, and psychological factors that may play a role in the depression. Regular exercise, a healthy diet, rest, and social support may also be strongly recommended.  Postpartum psychosis is more serious and needs treatment right away. Hospitalization is often needed. HOME CARE INSTRUCTIONS  Get as much rest as you can. Nap when the baby sleeps.  Exercise regularly. Some women find yoga and walking to be beneficial.  Eat a balanced and nourishing diet.  Do little things that you enjoy. Have a cup of tea, take a bubble bath, read your favorite magazine, or listen to your favorite music.  Avoid alcohol.  Ask for help with household chores, cooking, grocery shopping, or running errands as needed. Do not  try to do everything.  Talk to people close to you about how you are feeling. Get support from your partner, family members, friends, or other new moms.  Try to stay positive in how you think. Think about the things you are grateful for.  Do not spend a lot of time alone.  Only take medicine as directed by your caregiver.  Keep all your postpartum appointments.  Let your caregiver know if you have any concerns. SEEK MEDICAL CARE IF: You are having a reaction or problems with your medicine. SEEK IMMEDIATE MEDICAL CARE IF:  You have suicidal feelings.  You feel you may harm the baby or someone else. Document Released: 05/08/2004 Document Revised: 10/27/2011 Document Reviewed: 06/10/2011 [facility] Patient Information 2014 [place], [place].   Discharge to: home  Follow-up Information   Follow up with [facility]. Schedule an appointment as soon as possible for a visit in 5 weeks. (Call with any questions or concerns)    Specialty:  Obstetrics and Gynecology   Contact information:   [address] [place] [address] [phone]       Linda Hedges 01/08/2014

## 2014-01-09 MED ORDER — IBUPROFEN 600 MG PO TABS: 600.0000 mg | ORAL_TABLET | Freq: Four times a day (QID) | ORAL | Status: DC

## 2014-01-09 MED ORDER — OXYCODONE-ACETAMINOPHEN 5-325 MG PO TABS: 1.0000 | ORAL_TABLET | ORAL | Status: DC | PRN

## 2014-01-09 MED ORDER — FERROUS SULFATE 325 (65 FE) MG PO TABS: 325.0000 mg | ORAL_TABLET | Freq: Two times a day (BID) | ORAL | Status: DC

## 2014-01-09 NOTE — Discharge Instructions (Signed)
Postpartum Care After Vaginal Delivery After you deliver your newborn (postpartum period), the usual stay in the hospital is 24 72 hours. If there were problems with your labor or delivery, or if you have other medical problems, you might be in the hospital longer.  While you are in the hospital, you will receive help and instructions on how to care for yourself and your newborn during the postpartum period.  While you are in the hospital:  Be sure to tell your nurses if you have pain or discomfort, as well as where you feel the pain and what makes the pain worse.  If you had an incision made near your vagina (episiotomy) or if you had some tearing during delivery, the nurses may put ice packs on your episiotomy or tear. The ice packs may help to reduce the pain and swelling.  If you are breastfeeding, you may feel uncomfortable contractions of your uterus for a couple of weeks. This is normal. The contractions help your uterus get back to normal size.  It is normal to have some bleeding after delivery.  For the first 1 3 days after delivery, the flow is red and the amount may be similar to a period.  It is common for the flow to start and stop.  In the first few days, you may pass some small clots. Let your nurses know if you begin to pass large clots or your flow increases.  Do not  flush blood clots down the toilet before having the nurse look at them.  During the next 3 10 days after delivery, your flow should become more watery and pink or brown-tinged in color.  Ten to fourteen days after delivery, your flow should be a small amount of yellowish-white discharge.  The amount of your flow will decrease over the first few weeks after delivery. Your flow may stop in 6 8 weeks. Most women have had their flow stop by 12 weeks after delivery.  You should change your sanitary pads frequently.  Wash your hands thoroughly with soap and water for at least 20 seconds after changing pads, using  the toilet, or before holding or feeding your newborn.  You should feel like you need to empty your bladder within the first 6 8 hours after delivery.  In case you become weak, lightheaded, or faint, call your nurse before you get out of bed for the first time and before you take a shower for the first time.  Within the first few days after delivery, your breasts may begin to feel tender and full. This is called engorgement. Breast tenderness usually goes away within 48 72 hours after engorgement occurs. You may also notice milk leaking from your breasts. If you are not breastfeeding, do not stimulate your breasts. Breast stimulation can make your breasts produce more milk.  Spending as much time as possible with your newborn is very important. During this time, you and your newborn can feel close and get to know each other. Having your newborn stay in your room (rooming in) will help to strengthen the bond with your newborn. It will give you time to get to know your newborn and become comfortable caring for your newborn.  Your hormones change after delivery. Sometimes the hormone changes can temporarily cause you to feel sad or tearful. These feelings should not last more than a few days. If these feelings last longer than that, you should talk to your caregiver.  If desired, talk to your caregiver about  methods of family planning or contraception.  Talk to your caregiver about immunizations. Your caregiver may want you to have the following immunizations before leaving the hospital:  Tetanus, diphtheria, and pertussis (Tdap) or tetanus and diphtheria (Td) immunization. It is very important that you and your family (including grandparents) or others caring for your newborn are up-to-date with the Tdap or Td immunizations. The Tdap or Td immunization can help protect your newborn from getting ill.  Rubella immunization.  Varicella (chickenpox) immunization.  Influenza immunization. You should  receive this annual immunization if you did not receive the immunization during your pregnancy. Document Released: 06/01/2007 Document Revised: 04/28/2012 Document Reviewed: 03/31/2012 [facility] Patient Information 2014 [place].  Postpartum Depression and Baby Blues The postpartum period begins right after the birth of a baby. During this time, there is often a great amount of joy and excitement. It is also a time of considerable changes in the life of the parent(s). Regardless of how many times a mother gives birth, each child brings new challenges and dynamics to the family. It is not unusual to have feelings of excitement accompanied by confusing shifts in moods, emotions, and thoughts. All mothers are at risk of developing postpartum depression or the "baby blues." These mood changes can occur right after giving birth, or they may occur many months after giving birth. The baby blues or postpartum depression can be mild or severe. Additionally, postpartum depression can resolve rather quickly, or it can be a long-term condition. CAUSES Elevated hormones and their rapid decline are thought to be a main cause of postpartum depression and the baby blues. There are a number of hormones that radically change during and after pregnancy. Estrogen and progesterone usually decrease immediately after delivering your baby. The level of thyroid hormone and various cortisol steroids also rapidly drop. Other factors that play a major role in these changes include major life events and genetics.  RISK FACTORS If you have any of the following risks for the baby blues or postpartum depression, know what symptoms to watch out for during the postpartum period. Risk factors that may increase the likelihood of getting the baby blues or postpartum depression include:  Havinga personal or family history of depression.  Having depression while being pregnant.  Having premenstrual or oral contraceptive-associated  mood issues.  Having exceptional life stress.  Having marital conflict.  Lacking a social support network.  Having a baby with special needs.  Having health problems such as diabetes. SYMPTOMS Baby blues symptoms include:  Brief fluctuations in mood, such as going from extreme happiness to sadness.  Decreased concentration.  Difficulty sleeping.  Crying spells, tearfulness.  Irritability.  Anxiety. Postpartum depression symptoms typically begin within the first month after giving birth. These symptoms include:  Difficulty sleeping or excessive sleepiness.  Marked weight loss.  Agitation.  Feelings of worthlessness.  Lack of interest in activity or food. Postpartum psychosis is a very concerning condition and can be dangerous. Fortunately, it is rare. Displaying any of the following symptoms is cause for immediate medical attention. Postpartum psychosis symptoms include:  Hallucinations and delusions.  Bizarre or disorganized behavior.  Confusion or disorientation. DIAGNOSIS  A diagnosis is made by an evaluation of your symptoms. There are no medical or lab tests that lead to a diagnosis, but there are various questionnaires that a caregiver may use to identify those with the baby blues, postpartum depression, or psychosis. Often times, a screening tool called the [place] Postnatal Depression Scale is used to diagnose  depression in the postpartum period.  TREATMENT The baby blues usually goes away on its own in 1 to 2 weeks. Social support is often all that is needed. You should be encouraged to get adequate sleep and rest. Occasionally, you may be given medicines to help you sleep.  Postpartum depression requires treatment as it can last several months or longer if it is not treated. Treatment may include individual or group therapy, medicine, or both to address any social, physiological, and psychological factors that may play a role in the depression. Regular  exercise, a healthy diet, rest, and social support may also be strongly recommended.  Postpartum psychosis is more serious and needs treatment right away. Hospitalization is often needed. HOME CARE INSTRUCTIONS  Get as much rest as you can. Nap when the baby sleeps.  Exercise regularly. Some women find yoga and walking to be beneficial.  Eat a balanced and nourishing diet.  Do little things that you enjoy. Have a cup of tea, take a bubble bath, read your favorite magazine, or listen to your favorite music.  Avoid alcohol.  Ask for help with household chores, cooking, grocery shopping, or running errands as needed. Do not try to do everything.  Talk to people close to you about how you are feeling. Get support from your partner, family members, friends, or other new moms.  Try to stay positive in how you think. Think about the things you are grateful for.  Do not spend a lot of time alone.  Only take medicine as directed by your caregiver.  Keep all your postpartum appointments.  Let your caregiver know if you have any concerns. SEEK MEDICAL CARE IF: You are having a reaction or problems with your medicine. SEEK IMMEDIATE MEDICAL CARE IF:  You have suicidal feelings.  You feel you may harm the baby or someone else. Document Released: 05/08/2004 Document Revised: 10/27/2011 Document Reviewed: 05/16/2013 [facility] Patient Information 2014 [place], [place].  Breastfeeding Deciding to breastfeed is one of the best choices you can make for you and your baby. A change in hormones during pregnancy causes your breast tissue to grow and increases the number and size of your milk ducts. These hormones also allow proteins, sugars, and fats from your blood supply to make breast milk in your milk-producing glands. Hormones prevent breast milk from being released before your baby is born as well as prompt milk flow after birth. Once breastfeeding has begun, thoughts of your baby, as well as his  or her sucking or crying, can stimulate the release of milk from your milk-producing glands.  BENEFITS OF BREASTFEEDING For Your Baby  Your first milk (colostrum) helps your baby's digestive system function better.   There are antibodies in your milk that help your baby fight off infections.   Your baby has a lower incidence of asthma, allergies, and sudden infant death syndrome.   The nutrients in breast milk are better for your baby than infant formulas and are designed uniquely for your baby's needs.   Breast milk improves your baby's brain development.   Your baby is less likely to develop other conditions, such as childhood obesity, asthma, or type 2 diabetes mellitus.  For You   Breastfeeding helps to create a very special bond between you and your baby.   Breastfeeding is convenient. Breast milk is always available at the correct temperature and costs nothing.   Breastfeeding helps to burn calories and helps you lose the weight gained during pregnancy.   Breastfeeding makes  your uterus contract to its prepregnancy size faster and slows bleeding (lochia) after you give birth.   Breastfeeding helps to lower your risk of developing type 2 diabetes mellitus, osteoporosis, and breast or ovarian cancer later in life. SIGNS THAT YOUR BABY IS HUNGRY Early Signs of Hunger  Increased alertness or activity.  Stretching.  Movement of the head from side to side.  Movement of the head and opening of the mouth when the corner of the mouth or cheek is stroked (rooting).  Increased sucking sounds, smacking lips, cooing, sighing, or squeaking.  Hand-to-mouth movements.  Increased sucking of fingers or hands. Late Signs of Hunger  Fussing.  Intermittent crying. Extreme Signs of Hunger Signs of extreme hunger will require calming and consoling before your baby will be able to breastfeed successfully. Do not wait for the following signs of extreme hunger to occur before  you initiate breastfeeding:   Restlessness.  A loud, strong cry.   Screaming. BREASTFEEDING BASICS Breastfeeding Initiation  Find a comfortable place to sit or lie down, with your neck and back well supported.  Place a pillow or rolled up blanket under your baby to bring him or her to the level of your breast (if you are seated). Nursing pillows are specially designed to help support your arms and your baby while you breastfeed.  Make sure that your baby's abdomen is facing your abdomen.   Gently massage your breast. With your fingertips, massage from your chest wall toward your nipple in a circular motion. This encourages milk flow. You may need to continue this action during the feeding if your milk flows slowly.  Support your breast with 4 fingers underneath and your thumb above your nipple. Make sure your fingers are well away from your nipple and your baby's mouth.   Stroke your baby's lips gently with your finger or nipple.   When your baby's mouth is open wide enough, quickly bring your baby to your breast, placing your entire nipple and as much of the colored area around your nipple (areola) as possible into your baby's mouth.   More areola should be visible above your baby's upper lip than below the lower lip.   Your baby's tongue should be between his or her lower gum and your breast.   Ensure that your baby's mouth is correctly positioned around your nipple (latched). Your baby's lips should create a seal on your breast and be turned out (everted).  It is common for your baby to suck about 2 3 minutes in order to start the flow of breast milk. Latching Teaching your baby how to latch on to your breast properly is very important. An improper latch can cause nipple pain and decreased milk supply for you and poor weight gain in your baby. Also, if your baby is not latched onto your nipple properly, he or she may swallow some air during feeding. This can make your baby  fussy. Burping your baby when you switch breasts during the feeding can help to get rid of the air. However, teaching your baby to latch on properly is still the best way to prevent fussiness from swallowing air while breastfeeding. Signs that your baby has successfully latched on to your nipple:    Silent tugging or silent sucking, without causing you pain.   Swallowing heard between every 3 4 sucks.    Muscle movement above and in front of his or her ears while sucking.  Signs that your baby has not successfully latched on  to nipple:   Sucking sounds or smacking sounds from your baby while breastfeeding.  Nipple pain. If you think your baby has not latched on correctly, slip your finger into the corner of your baby's mouth to break the suction and place it between your baby's gums. Attempt breastfeeding initiation again. Signs of Successful Breastfeeding Signs from your baby:   A gradual decrease in the number of sucks or complete cessation of sucking.   Falling asleep.   Relaxation of his or her body.   Retention of a small amount of milk in his or her mouth.   Letting go of your breast by himself or herself. Signs from you:  Breasts that have increased in firmness, weight, and size 1 3 hours after feeding.   Breasts that are softer immediately after breastfeeding.  Increased milk volume, as well as a change in milk consistency and color by the 5th day of breastfeeding.   Nipples that are not sore, cracked, or bleeding. Signs That Your [employer]el Books is Getting Enough Milk  Wetting at least 3 diapers in a 24-hour period. The urine should be clear and pale yellow by age 511 days.  At least 3 stools in a 24-hour period by age 511 days. The stool should be soft and yellow.  At least 3 stools in a 24-hour period by age 27 days. The stool should be seedy and yellow.  No loss of weight greater than 10% of birth weight during the first 25 days of age.  Average weight gain of 4 7  ounces (120 210 mL) per week after age 51 days.  Consistent daily weight gain by age 8 days, without weight loss after the age of 2 weeks. After a feeding, your baby may spit up a small amount. This is common. BREASTFEEDING FREQUENCY AND DURATION Frequent feeding will help you make more milk and can prevent sore nipples and breast engorgement. Breastfeed when you feel the need to reduce the fullness of your breasts or when your baby shows signs of hunger. This is called "breastfeeding on demand." Avoid introducing a pacifier to your baby while you are working to establish breastfeeding (the first 4 6 weeks after your baby is born). After this time you may choose to use a pacifier. Research has shown that pacifier use during the first year of a baby's life decreases the risk of sudden infant death syndrome (SIDS). Allow your baby to feed on each breast as long as he or she wants. Breastfeed until your baby is finished feeding. When your baby unlatches or falls asleep while feeding from the first breast, offer the second breast. Because newborns are often sleepy in the first few weeks of life, you may need to awaken your baby to get him or her to feed. Breastfeeding times will vary from baby to baby. However, the following rules can serve as a guide to help you ensure that your baby is properly fed:  Newborns (babies 57 weeks of age or younger) may breastfeed every 1 3 hours.  Newborns should not go longer than 3 hours during the day or 5 hours during the night without breastfeeding.  You should breastfeed your baby a minimum of 8 times in a 24-hour period until you begin to introduce solid foods to your baby at around 87 months of age. BREAST MILK PUMPING Pumping and storing breast milk allows you to ensure that your baby is exclusively fed your breast milk, even at times when you are unable to breastfeed. This  is especially important if you are going back to work while you are still breastfeeding or when  you are not able to be present during feedings. Your lactation consultant can give you guidelines on how long it is safe to store breast milk.  A breast pump is a machine that allows you to pump milk from your breast into a sterile bottle. The pumped breast milk can then be stored in a refrigerator or freezer. Some breast pumps are operated by hand, while others use electricity. Ask your lactation consultant which type will work best for you. Breast pumps can be purchased, but some hospitals and breastfeeding support groups lease breast pumps on a monthly basis. A lactation consultant can teach you how to hand express breast milk, if you prefer not to use a pump.  CARING FOR YOUR BREASTS WHILE YOU BREASTFEED Nipples can become dry, cracked, and sore while breastfeeding. The following recommendations can help keep your breasts moisturized and healthy:  Avoid using soap on your nipples.   Wear a supportive bra. Although not required, special nursing bras and tank tops are designed to allow access to your breasts for breastfeeding without taking off your entire bra or top. Avoid wearing underwire style bras or extremely tight bras.  Air dry your nipples for 3 5mnutes after each feeding.   Use only cotton bra pads to absorb leaked breast milk. Leaking of breast milk between feedings is normal.   Use lanolin on your nipples after breastfeeding. Lanolin helps to maintain your skin's normal moisture barrier. If you use pure lanolin you do not need to wash it off before feeding your baby again. Pure lanolin is not toxic to your baby. You may also hand express a few drops of breast milk and gently massage that milk into your nipples and allow the milk to air dry. In the first few weeks after giving birth, some women experience extremely full breasts (engorgement). Engorgement can make your breasts feel heavy, warm, and tender to the touch. Engorgement peaks within 3 5 days after you give birth. The  following recommendations can help ease engorgement:  Completely empty your breasts while breastfeeding or pumping. You may want to start by applying warm, moist heat (in the shower or with warm water-soaked hand towels) just before feeding or pumping. This increases circulation and helps the milk flow. If your baby does not completely empty your breasts while breastfeeding, pump any extra milk after he or she is finished.  Wear a snug bra (nursing or regular) or tank top for 1 2 days to signal your body to slightly decrease milk production.  Apply ice packs to your breasts, unless this is too uncomfortable for you.  Make sure that your baby is latched on and positioned properly while breastfeeding. If engorgement persists after 48 hours of following these recommendations, contact your health care provider or a lScience writer OVERALL HEALTH CARE RECOMMENDATIONS WHILE BREASTFEEDING  Eat healthy foods. Alternate between meals and snacks, eating 3 of each per day. Because what you eat affects your breast milk, some of the foods may make your baby more irritable than usual. Avoid eating these foods if you are sure that they are negatively affecting your baby.  Drink milk, fruit juice, and water to satisfy your thirst (about 10 glasses a day).   Rest often, relax, and continue to take your prenatal vitamins to prevent fatigue, stress, and anemia.  Continue breast self-awareness checks.  Avoid chewing and smoking tobacco.  Avoid alcohol and  drug use. Some medicines that may be harmful to your baby can pass through breast milk. It is important to ask your health care provider before taking any medicine, including all over-the-counter and prescription medicine as well as vitamin and herbal supplements. It is possible to become pregnant while breastfeeding. If birth control is desired, ask your health care provider about options that will be safe for your baby. SEEK MEDICAL CARE IF:   You  feel like you want to stop breastfeeding or have become frustrated with breastfeeding.  You have painful breasts or nipples.  Your nipples are cracked or bleeding.  Your breasts are red, tender, or warm.  You have a swollen area on either breast.  You have a fever or chills.  You have nausea or vomiting.  You have drainage other than breast milk from your nipples.  Your breasts do not become full before feedings by the 5th day after you give birth.  You feel sad and depressed.  Your baby is too sleepy to eat well.  Your baby is having trouble sleeping.   Your baby is wetting less than 3 diapers in a 24-hour period.  Your baby has less than 3 stools in a 24-hour period.  Your baby's skin or the white part of his or her eyes becomes yellow.   Your baby is not gaining weight by 31 days of age. SEEK IMMEDIATE MEDICAL CARE IF:   Your baby is overly tired (lethargic) and does not want to wake up and feed.  Your baby develops an unexplained fever. Document Released: 08/04/2005 Document Revised: 04/06/2013 Document Reviewed: 01/26/2013 [facility] Patient Information 2014 [place].  Contraception Choices Contraception (birth control) is the use of any methods or devices to prevent pregnancy. Below are some methods to help avoid pregnancy. HORMONAL METHODS   Contraceptive implant This is a thin, plastic tube containing progesterone hormone. It does not contain estrogen hormone. Your health care provider inserts the tube in the inner part of the upper arm. The tube can remain in place for up to 3 years. After 3 years, the implant must be removed. The implant prevents the ovaries from releasing an egg (ovulation), thickens the cervical mucus to prevent sperm from entering the uterus, and thins the lining of the inside of the uterus.  Progesterone-only injections These injections are given every 3 months by your health care provider to prevent pregnancy. This synthetic  progesterone hormone stops the ovaries from releasing eggs. It also thickens cervical mucus and changes the uterine lining. This makes it harder for sperm to survive in the uterus.  Birth control pills These pills contain estrogen and progesterone hormone. They work by preventing the ovaries from releasing eggs (ovulation). They also cause the cervical mucus to thicken, preventing the sperm from entering the uterus. Birth control pills are prescribed by a health care provider.Birth control pills can also be used to treat heavy periods.  Minipill This type of birth control pill contains only the progesterone hormone. They are taken every day of each month and must be prescribed by your health care provider.  Birth control patch The patch contains hormones similar to those in birth control pills. It must be changed once a week and is prescribed by a health care provider.  Vaginal ring The ring contains hormones similar to those in birth control pills. It is left in the vagina for 3 weeks, removed for 1 week, and then a new one is put back in place. The patient must be  comfortable inserting and removing the ring from the vagina.A health care provider's prescription is necessary.  Emergency contraception Emergency contraceptives prevent pregnancy after unprotected sexual intercourse. This pill can be taken right after sex or up to 5 days after unprotected sex. It is most effective the sooner you take the pills after having sexual intercourse. Most emergency contraceptive pills are available without a prescription. Check with your pharmacist. Do not use emergency contraception as your only form of birth control. BARRIER METHODS   Female condom This is a thin sheath (latex or rubber) that is worn over the penis during sexual intercourse. It can be used with spermicide to increase effectiveness.  Female condom. This is a soft, loose-fitting sheath that is put into the vagina before sexual  intercourse.  Diaphragm This is a soft, latex, dome-shaped barrier that must be fitted by a health care provider. It is inserted into the vagina, along with a spermicidal jelly. It is inserted before intercourse. The diaphragm should be left in the vagina for 6 to 8 hours after intercourse.  Cervical cap This is a round, soft, latex or plastic cup that fits over the cervix and must be fitted by a health care provider. The cap can be left in place for up to 48 hours after intercourse.  Sponge This is a soft, circular piece of polyurethane foam. The sponge has spermicide in it. It is inserted into the vagina after wetting it and before sexual intercourse.  Spermicides These are chemicals that kill or block sperm from entering the cervix and uterus. They come in the form of creams, jellies, suppositories, foam, or tablets. They do not require a prescription. They are inserted into the vagina with an applicator before having sexual intercourse. The process must be repeated every time you have sexual intercourse. INTRAUTERINE CONTRACEPTION  Intrauterine device (IUD) This is a T-shaped device that is put in a woman's uterus during a menstrual period to prevent pregnancy. There are 2 types:  Copper IUD This type of IUD is wrapped in copper wire and is placed inside the uterus. Copper makes the uterus and fallopian tubes produce a fluid that kills sperm. It can stay in place for 10 years.  Hormone IUD This type of IUD contains the hormone progestin (synthetic progesterone). The hormone thickens the cervical mucus and prevents sperm from entering the uterus, and it also thins the uterine lining to prevent implantation of a fertilized egg. The hormone can weaken or kill the sperm that get into the uterus. It can stay in place for 3 5 years, depending on which type of IUD is used. PERMANENT METHODS OF CONTRACEPTION  Female tubal ligation This is when the woman's fallopian tubes are surgically sealed, tied, or  blocked to prevent the egg from traveling to the uterus.  Hysteroscopic sterilization This involves placing a small coil or insert into each fallopian tube. Your doctor uses a technique called hysteroscopy to do the procedure. The device causes scar tissue to form. This results in permanent blockage of the fallopian tubes, so the sperm cannot fertilize the egg. It takes about 3 months after the procedure for the tubes to become blocked. You must use another form of birth control for these 3 months.  Female sterilization This is when the female has the tubes that carry sperm tied off (vasectomy).This blocks sperm from entering the vagina during sexual intercourse. After the procedure, the man can still ejaculate fluid (semen). NATURAL PLANNING METHODS  Natural family planning This is not having  sexual intercourse or using a barrier method (condom, diaphragm, cervical cap) on days the woman could become pregnant.  Calendar method This is keeping track of the length of each menstrual cycle and identifying when you are fertile.  Ovulation method This is avoiding sexual intercourse during ovulation.  Symptothermal method This is avoiding sexual intercourse during ovulation, using a thermometer and ovulation symptoms.  Post ovulation method This is timing sexual intercourse after you have ovulated. Regardless of which type or method of contraception you choose, it is important that you use condoms to protect against the transmission of sexually transmitted infections (STIs). Talk with your health care provider about which form of contraception is most appropriate for you. Document Released: 08/04/2005 Document Revised: 04/06/2013 Document Reviewed: 01/27/2013 [facility] Patient Information 2014 [place].   Iron-Rich Diet An iron-rich diet contains foods that are good sources of iron. Iron is an important mineral that helps your body produce hemoglobin. Hemoglobin is a protein in red blood cells  that carries oxygen to the body's tissues. Sometimes, the iron level in your blood can be low. This may be caused by:  A lack of iron in your diet.  Blood loss.  Times of growth, such as during pregnancy or during a child's growth and development. Low levels of iron can cause a decrease in the number of red blood cells. This can result in iron deficiency anemia. Iron deficiency anemia symptoms include:  Tiredness.  Weakness.  Irritability.  Increased chance of infection. Here are some recommendations for daily iron intake:  Males older than 24 years of age need 8 mg of iron per day.  Women ages 65 to 64 need 18 mg of iron per day.  Pregnant women need 27 mg of iron per day, and women who are over 82 years of age and breastfeeding need 9 mg of iron per day.  Women over the age of 24 need 8 mg of iron per day. SOURCES OF IRON There are 2 types of iron that are found in food: heme iron and nonheme iron. Heme iron is absorbed by the body better than nonheme iron. Heme iron is found in meat, poultry, and fish. Nonheme iron is found in grains, beans, and vegetables. Heme Iron Sources Food / Iron (mg)  Chicken liver, 3 oz (85 g)/ 10 mg  Beef liver, 3 oz (85 g)/ 5.5 mg  Oysters, 3 oz (85 g)/ 8 mg  Beef, 3 oz (85 g)/ 2 to 3 mg  Shrimp, 3 oz (85 g)/ 2.8 mg  [place], 3 oz (85 g)/ 2 mg  Chicken, 3 oz (85 g) / 1 mg  Fish (tuna, halibut), 3 oz (85 g)/ 1 mg  Pork, 3 oz (85 g)/ 0.9 mg Nonheme Iron Sources Food / Iron (mg)  Ready-to-eat breakfast cereal, iron-fortified / 3.9 to 7 mg  Tofu,  cup / 3.4 mg  Kidney beans,  cup / 2.6 mg  Baked potato with skin / 2.7 mg  Asparagus,  cup / 2.2 mg  Avocado / 2 mg  Dried peaches,  cup / 1.6 mg  Raisins,  cup / 1.5 mg  Soy milk, 1 cup / 1.5 mg  Whole-wheat bread, 1 slice / 1.2 mg  Spinach, 1 cup / 0.8 mg  Broccoli,  cup / 0.6 mg IRON ABSORPTION Certain foods can decrease the body's absorption of iron. Try to avoid  these foods and beverages while eating meals with iron-containing foods:  Coffee.  Tea.  Fiber.  Soy. Foods containing  vitamin C can help increase the amount of iron your body absorbs from iron sources, especially from nonheme sources. Eat foods with vitamin C along with iron-containing foods to increase your iron absorption. Foods that are high in vitamin C include many fruits and vegetables. Some good sources are:  Fresh orange juice.  Oranges.  Strawberries.  Mangoes.  Grapefruit.  Red bell peppers.  Green bell peppers.  Broccoli.  Potatoes with skin.  Tomato juice. Document Released: 03/18/2005 Document Revised: 10/27/2011 Document Reviewed: 01/23/2011 [facility] Patient Information 2014 [place], [place].

## 2014-01-10 NOTE — Progress Notes (Signed)
Post discharge chart review completed.  

## 2014-01-13 ENCOUNTER — Inpatient Hospital Stay (HOSPITAL_COMMUNITY): Admission: RE | Admit: 2014-01-13 | Payer: No Typology Code available for payment source | Source: Ambulatory Visit

## 2014-06-19 ENCOUNTER — Encounter (HOSPITAL_COMMUNITY): Payer: Self-pay

## 2016-09-29 DIAGNOSIS — Z3040 Encounter for surveillance of contraceptives, unspecified: Secondary | ICD-10-CM | POA: Diagnosis not present

## 2016-09-29 DIAGNOSIS — Z124 Encounter for screening for malignant neoplasm of cervix: Secondary | ICD-10-CM | POA: Diagnosis not present

## 2016-09-29 DIAGNOSIS — Z113 Encounter for screening for infections with a predominantly sexual mode of transmission: Secondary | ICD-10-CM | POA: Diagnosis not present

## 2016-09-29 DIAGNOSIS — Z01419 Encounter for gynecological examination (general) (routine) without abnormal findings: Secondary | ICD-10-CM | POA: Diagnosis not present

## 2016-10-07 DIAGNOSIS — H5213 Myopia, bilateral: Secondary | ICD-10-CM | POA: Diagnosis not present

## 2016-11-25 DIAGNOSIS — L309 Dermatitis, unspecified: Secondary | ICD-10-CM | POA: Diagnosis not present

## 2016-11-25 DIAGNOSIS — R200 Anesthesia of skin: Secondary | ICD-10-CM | POA: Diagnosis not present

## 2016-11-25 DIAGNOSIS — Z131 Encounter for screening for diabetes mellitus: Secondary | ICD-10-CM | POA: Diagnosis not present

## 2016-11-25 DIAGNOSIS — R202 Paresthesia of skin: Secondary | ICD-10-CM | POA: Diagnosis not present

## 2016-11-25 DIAGNOSIS — M25551 Pain in right hip: Secondary | ICD-10-CM | POA: Diagnosis not present

## 2016-11-27 ENCOUNTER — Ambulatory Visit (HOSPITAL_COMMUNITY)
Admission: RE | Admit: 2016-11-27 | Discharge: 2016-11-27 | Disposition: A | Discharge status: Home / Self Care | Payer: 59 | Source: Ambulatory Visit | Attending: Family Medicine | Admitting: Family Medicine

## 2016-11-27 ENCOUNTER — Other Ambulatory Visit: Payer: Self-pay | Admitting: Family Medicine

## 2016-11-27 DIAGNOSIS — M25551 Pain in right hip: Secondary | ICD-10-CM

## 2017-02-14 ENCOUNTER — Encounter (HOSPITAL_COMMUNITY): Payer: Self-pay | Admitting: Licensed Clinical Social Worker

## 2017-02-14 ENCOUNTER — Emergency Department (HOSPITAL_COMMUNITY)
Admission: EM | Admit: 2017-02-14 | Discharge: 2017-02-15 | Disposition: A | Discharge status: Home / Self Care | Payer: 59 | Attending: Emergency Medicine | Admitting: Emergency Medicine

## 2017-02-14 DIAGNOSIS — F419 Anxiety disorder, unspecified: Secondary | ICD-10-CM | POA: Diagnosis not present

## 2017-02-14 DIAGNOSIS — J45909 Unspecified asthma, uncomplicated: Secondary | ICD-10-CM | POA: Insufficient documentation

## 2017-02-14 DIAGNOSIS — F4321 Adjustment disorder with depressed mood: Secondary | ICD-10-CM | POA: Diagnosis present

## 2017-02-14 DIAGNOSIS — R45851 Suicidal ideations: Secondary | ICD-10-CM | POA: Diagnosis not present

## 2017-02-14 DIAGNOSIS — Z87891 Personal history of nicotine dependence: Secondary | ICD-10-CM | POA: Insufficient documentation

## 2017-02-14 LAB — BASIC METABOLIC PANEL
Anion gap: 7 (ref 5–15)
BUN: 13 mg/dL (ref 6–20)
CO2: 28 mmol/L (ref 22–32)
Calcium: 9.4 mg/dL (ref 8.9–10.3)
Chloride: 104 mmol/L (ref 101–111)
Creatinine, Ser: 0.78 mg/dL (ref 0.44–1.00)
GFR calc Af Amer: >60 mL/min (ref >60)
GFR calc non Af Amer: >60 mL/min (ref >60)
Glucose, Bld: 96 mg/dL (ref 65–99)
Potassium: 3.5 mmol/L (ref 3.5–5.1)
Sodium: 139 mmol/L (ref 135–145)

## 2017-02-14 LAB — CBC WITH DIFFERENTIAL/PLATELET
Basophils Absolute: 0 10*3/uL (ref 0.0–0.1)
Basophils Relative: 0 %
Eosinophils Absolute: 0.1 10*3/uL (ref 0.0–0.7)
Eosinophils Relative: 1 %
HCT: 34.9 % — ABNORMAL LOW (ref 36.0–46.0)
Hemoglobin: 11.8 g/dL — ABNORMAL LOW (ref 12.0–15.0)
Lymphocytes Relative: 39 %
Lymphs Abs: 2.1 10*3/uL (ref 0.7–4.0)
MCH: 27.8 pg (ref 26.0–34.0)
MCHC: 33.8 g/dL (ref 30.0–36.0)
MCV: 82.1 fL (ref 78.0–100.0)
Monocytes Absolute: 0.3 10*3/uL (ref 0.1–1.0)
Monocytes Relative: 6 %
Neutro Abs: 2.8 10*3/uL (ref 1.7–7.7)
Neutrophils Relative %: 54 %
Platelets: 327 10*3/uL (ref 150–400)
RBC: 4.25 MIL/uL (ref 3.87–5.11)
RDW: 13.9 % (ref 11.5–15.5)
WBC: 5.3 10*3/uL (ref 4.0–10.5)

## 2017-02-14 LAB — RAPID URINE DRUG SCREEN, HOSP PERFORMED
Amphetamines: NOT DETECTED
Barbiturates: NOT DETECTED
Benzodiazepines: NOT DETECTED
Cocaine: NOT DETECTED
Opiates: NOT DETECTED
Tetrahydrocannabinol: NOT DETECTED

## 2017-02-14 LAB — ETHANOL: Alcohol, Ethyl (B): <5 mg/dL (ref <5)

## 2017-02-14 LAB — I-STAT BETA HCG BLOOD, ED (MC, WL, AP ONLY): I-stat hCG, quantitative: <5 m[IU]/mL (ref <5)

## 2017-02-14 MED ORDER — HYDROXYZINE HCL 25 MG PO TABS
25.0000 mg | ORAL_TABLET | Freq: Three times a day (TID) | ORAL | Status: DC | PRN
  Administered 2017-02-14: 25 mg via ORAL
  Filled 2017-02-14: qty 1

## 2017-02-14 MED ORDER — ACETAMINOPHEN 325 MG PO TABS
650.0000 mg | ORAL_TABLET | ORAL | Status: DC | PRN
  Administered 2017-02-15: 650 mg via ORAL
  Filled 2017-02-14: qty 2

## 2017-02-14 MED ORDER — ALUM & MAG HYDROXIDE-SIMETH 200-200-20 MG/5ML PO SUSP: 30.0000 mL | Freq: Four times a day (QID) | ORAL | Status: DC | PRN

## 2017-02-14 MED ORDER — TRAZODONE HCL 50 MG PO TABS: 50.0000 mg | ORAL_TABLET | Freq: Every evening | ORAL | Status: DC | PRN

## 2017-02-14 MED ORDER — ONDANSETRON HCL 4 MG PO TABS: 4.0000 mg | ORAL_TABLET | Freq: Three times a day (TID) | ORAL | Status: DC | PRN
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
## 2017-02-14 NOTE — ED Notes (Signed)
Bed: WA27 Expected date:  Expected time:  Means of arrival:  Comments: 

## 2017-02-14 NOTE — ED Notes (Signed)
Patient talking to Waunita Schooner the counselor.

## 2017-02-14 NOTE — BH Assessment (Signed)
[place] Assessment Progress Note   Case was staffed with Akintayo MD who recommended patient be re-valuated in the a.m.  Patient agrees with disposition and open to medication interventions.

## 2017-02-14 NOTE — ED Provider Notes (Signed)
[facility] DEPT Provider Note   CSN: [id number] Arrival date & time: 02/14/17  0957     History   Chief Complaint Chief Complaint  Patient presents with  . Suicidal    HPI Shirley Decamp is a 26 y.o. female.  HPI Magdalene Tardiff is a 26 y.o. female presents to emergency department complaining of suicidal thoughts. 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 She is a single mother. She states that she believes the stressors are causing her to be "down" and recently started having thoughts about harming herself. She does not have a particular plan but she states she has thought about how she might do it but states that she is not brave enough to do it. Today she was supposed to go to work, but decided to come here to get some help. She does not have diagnosed history of depression or anxiety. She has had to go to counseling when pregnant 3 years ago. She denies ever being on any medications. She denies drugs or alcohol. No other complaints.   Past Medical History:  Diagnosis Date  . Asthma   . Dysrhythmia   . Hx of chlamydia infection     Patient Active Problem List   Diagnosis Date Noted  . NSVD (normal spontaneous vaginal delivery) 01/07/2014  . Normal labor 01/06/2014    Past Surgical History:  Procedure Laterality Date  . NO PAST SURGERIES      OB History    Gravida Para Term Preterm AB Living   2 1 1   1 1    SAB TAB Ectopic Multiple Live Births     1     1       Home Medications    Prior to Admission medications   Medication Sig Start Date End Date Taking? Authorizing Provider  acetaminophen (TYLENOL) 500 MG tablet Take 500-1,000 mg by mouth every 6 (six) hours as needed for headache.    [provider]  albuterol (PROVENTIL HFA;VENTOLIN HFA) 108 (90 BASE) MCG/ACT inhaler Inhale 2 puffs into the lungs every 4 (four)  hours as needed for shortness of breath.    [provider]  calcium carbonate (TUMS - DOSED IN MG ELEMENTAL CALCIUM) 500 MG chewable tablet Chew 1 tablet by mouth daily as needed for indigestion or heartburn.    [provider]  ferrous sulfate 325 (65 FE) MG tablet Take 1 tablet (325 mg total) by mouth 2 (two) times daily with a meal. 01/09/14   Linda Hedges, CNM  ibuprofen (ADVIL,MOTRIN) 600 MG tablet Take 1 tablet (600 mg total) by mouth every 6 (six) hours. 01/09/14   Linda Hedges, CNM  oxyCODONE-acetaminophen (PERCOCET/ROXICET) 5-325 MG per tablet Take 1-2 tablets by mouth every 4 (four) hours as needed for severe pain (moderate - severe pain). 01/09/14   Linda Hedges, CNM  Prenatal Vit-Fe Fumarate-FA (PRENATAL MULTIVITAMIN) TABS tablet Take 1 tablet by mouth daily at 12 noon.    [provider]    Family History Family History  Problem Relation Age of Onset  . Polydactyly Paternal Uncle   . Sickle cell anemia Cousin   . Diabetes Mother   . Diabetes Maternal Aunt   . Diabetes Maternal Grandmother   . Diabetes Maternal Grandfather     Social History Social History  Substance Use Topics  . Smoking status: Former Smoker  Quit date: 10/20/2010  . Smokeless tobacco: Never Used  . Alcohol use Yes     Comment: Occas. prior to pregnancy     Allergies   Aspirin; Coconut oil; Lemon balm [melissa officinalis]; and Copper-containing compounds   Review of Systems Review of Systems  Constitutional: Negative for chills and fever.  Respiratory: Negative for cough, chest tightness and shortness of breath.   Cardiovascular: Negative for chest pain, palpitations and leg swelling.  Musculoskeletal: Negative for arthralgias, myalgias, neck pain and neck stiffness.  Skin: Negative for rash.  Neurological: Negative for dizziness, weakness and headaches.  Psychiatric/Behavioral: Positive for dysphoric mood and suicidal ideas.  All other systems reviewed and  are negative.    Physical Exam Updated Vital Signs There were no vitals taken for this visit.  Physical Exam  Constitutional: She is oriented to person, place, and time. She appears well-developed and well-nourished. No distress.  HENT:  Head: Normocephalic.  Eyes: Conjunctivae are normal.  Neck: Neck supple.  Cardiovascular: Normal rate, regular rhythm and normal heart sounds.   Pulmonary/Chest: Effort normal and breath sounds normal. No respiratory distress. She has no wheezes. She has no rales.  Abdominal: Soft. Bowel sounds are normal. She exhibits no distension. There is no tenderness. There is no rebound.  Musculoskeletal: She exhibits no edema.  Neurological: She is alert and oriented to person, place, and time.  Skin: Skin is warm and dry.  Psychiatric: She has a normal mood and affect. Her behavior is normal.  Expresses suicidal thoughs  Nursing note and vitals reviewed.    ED Treatments / Results  Labs (all labs ordered are listed, but only abnormal results are displayed) Labs Reviewed  CBC WITH DIFFERENTIAL/PLATELET - Abnormal; Notable for the following:       Result Value   Hemoglobin 11.8 (*)    HCT 34.9 (*)    All other components within normal limits  BASIC METABOLIC PANEL  ETHANOL  RAPID URINE DRUG SCREEN, HOSP PERFORMED  I-STAT BETA HCG BLOOD, ED ([facility], [facility], [facility] ONLY)    EKG  EKG Interpretation None       Radiology No results found.  Procedures Procedures (including critical care time)  Medications Ordered in ED Medications  ondansetron (ZOFRAN) tablet 4 mg (not administered)  acetaminophen (TYLENOL) tablet 650 mg (not administered)  alum & mag hydroxide-simeth (MAALOX/MYLANTA) 200-200-20 MG/5ML suspension 30 mL (not administered)     Initial Impression / Assessment and Plan / ED Course  I have reviewed the triage vital signs and the nursing notes.  Pertinent labs & imaging results that were available during my care of the patient were  reviewed by me and considered in my medical decision making (see chart for details).    PT in ED with suicidal thoughts, stress, depression symptoms. Will get med clearance labs, and then TTS consult.   12:18 PM Pt is medically cleared, pending TTS consult  2:37 PM Pt evaluated by TTS, we will hold patient overnight and reevaluate in the morning.  Vitals:   02/14/17 1448  BP: 133/75  Pulse: 70  Resp: 20  Temp: 99.8 F (37.7 C)  TempSrc: Oral  SpO2: 100%     Final Clinical Impressions(s) / ED Diagnoses   Final diagnoses:  Suicidal thoughts    New Prescriptions New Prescriptions   No medications on file     Jeannett Senior, Hershal Coria 02/14/17 1536    Mesner, Corene Cornea, MD 02/15/17 [phone]

## 2017-02-14 NOTE — ED Triage Notes (Signed)
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  Patient is alert and oriented, calm and cooperative.  Chaplain at bedside and PA.

## 2017-02-15 DIAGNOSIS — F419 Anxiety disorder, unspecified: Secondary | ICD-10-CM | POA: Diagnosis not present

## 2017-02-15 DIAGNOSIS — F4321 Adjustment disorder with depressed mood: Secondary | ICD-10-CM

## 2017-02-15 DIAGNOSIS — Z87891 Personal history of nicotine dependence: Secondary | ICD-10-CM

## 2017-02-15 HISTORY — DX: Adjustment disorder with depressed mood: F43.21

## 2017-02-15 MED ORDER — HYDROXYZINE HCL 25 MG PO TABS: 25.0000 mg | ORAL_TABLET | Freq: Three times a day (TID) | ORAL | 0 refills | Status: DC | PRN

## 2017-02-15 MED ORDER — TRAZODONE HCL 50 MG PO TABS: 50.0000 mg | ORAL_TABLET | Freq: Every evening | ORAL | 0 refills | Status: DC | PRN

## 2017-02-15 NOTE — ED Notes (Signed)
Pt c/o mild headache, will medicate.

## 2017-02-15 NOTE — [facility] Suicide Risk Assessment (Signed)
Suicide Risk Assessment  Discharge Assessment   [facility] Discharge Suicide Risk Assessment   Principal Problem: Adjustment disorder with depressed mood Discharge Diagnoses:  Patient Active Problem List   Diagnosis Date Noted  . Adjustment disorder with depressed mood [F43.21] 02/15/2017    Priority: High  . NSVD (normal spontaneous vaginal delivery) [O80] 01/07/2014  . Normal labor [O80, Z37.9] 01/06/2014    Total Time spent with patient: 45 minutes  Musculoskeletal: Strength & Muscle Tone: within normal limits Gait & Station: normal Patient leans: N/A  Psychiatric Specialty Exam: Physical Exam  Constitutional: She is oriented to person, place, and time. She appears well-developed and well-nourished.  HENT:  Head: Normocephalic.  Neck: Normal range of motion.  Respiratory: Effort normal.  Musculoskeletal: Normal range of motion.  Neurological: She is alert and oriented to person, place, and time.  Psychiatric: Her speech is normal and behavior is normal. Judgment and thought content normal. Cognition and memory are normal. She exhibits a depressed mood.    Review of Systems  Psychiatric/Behavioral: Positive for depression.  All other systems reviewed and are negative.   Blood pressure 111/65, pulse 74, temperature 98 F (36.7 C), temperature source Oral, resp. rate 18, SpO2 91 %, unknown if currently breastfeeding.There is no height or weight on file to calculate BMI.  General Appearance: Casual  Eye Contact:  Good  Speech:  Normal Rate  Volume:  Normal  Mood:  Depressed, mild  Affect:  Congruent  Thought Process:  Coherent and Descriptions of Associations: Intact  Orientation:  Full (Time, Place, and Person)  Thought Content:  WDL and Logical  Suicidal Thoughts:  No  Homicidal Thoughts:  No  Memory:  Immediate;   Good Recent;   Good Remote;   Good  Judgement:  Good  Insight:  Good  Psychomotor Activity:  Normal  Concentration:  Concentration: Good and Attention  Span: Good  Recall:  Good  Fund of Knowledge:  Good  Language:  Good  Akathisia:  No  Handed:  Right  AIMS (if indicated):     Assets:  Leisure Time Physical Health Resilience Social Support  ADL's:  Intact  Cognition:  WNL  Sleep:       Mental Status Per Nursing Assessment::   On Admission:   depression  Demographic Factors:  Adolescent or young adult  Loss Factors: NA  Historical Factors: NA  Risk Reduction Factors:   Responsible for children under 45 years of age, Sense of responsibility to family, Employed, Living with another person, especially a relative, Positive social support and Positive coping skills or problem solving skills  Continued Clinical Symptoms:  Depressed, mild  Cognitive Features That Contribute To Risk:  None    Suicide Risk:  Minimal: No identifiable suicidal ideation.  Patients presenting with no risk factors but with morbid ruminations; may be classified as minimal risk based on the severity of the depressive symptoms    Plan Of Care/Follow-up recommendations:  Activity:  as tolerated Diet:  heart healthy diet  LORD, JAMISON, NP 02/15/2017, 11:07 AM

## 2017-02-15 NOTE — Consult Note (Signed)
[facility] Face-to-Face Psychiatry Consult   Reason for Consult:  Depression Referring Physician:  EDP Patient Identification: Dawn Zamora MRN:  [id number] Principal Diagnosis: Adjustment disorder with depressed mood Diagnosis:   Patient Active Problem List   Diagnosis Date Noted  . Adjustment disorder with depressed mood [F43.21] 02/15/2017    Priority: High  . NSVD (normal spontaneous vaginal delivery) [O80] 01/07/2014  . Normal labor [O80, Z37.9] 01/06/2014    Total Time spent with patient: 45 minutes  Subjective:   Dawn Zamora is a 25 y.o. female patient does not warrant admission.  HPI:  25 yo female who had some passive, intermittent suicidal ideations and sought assistance.  After talking to the counselor and chaplain yesterday, she feels she has gained a new perspective and no longer has suicidal ideations.  Dawn Zamora also was able to sleep last night which improved her mood also. She is agreeable to go to counseling to help her with her stress.  Dawn Zamora is ready to go home, her mother is coming to stay with her and assist her.  Stable for discharge.  Past Psychiatric History: none  Risk to Self: None Risk to Others: Homicidal Ideation: No Thoughts of Harm to Others: No Current Homicidal Intent: No Current Homicidal Plan: No Access to Homicidal Means: No Identified Victim: NA History of harm to others?: No Assessment of Violence: None Noted Violent Behavior Description: NA Does patient have access to weapons?: No Criminal Charges Pending?: No Does patient have a court date: No Prior Inpatient Therapy: Prior Inpatient Therapy: No Prior Therapy Dates: NA Prior Therapy Facilty/Provider(s): NA Reason for Treatment: NA Prior Outpatient Therapy: Prior Outpatient Therapy: No Prior Therapy Dates: na Prior Therapy Facilty/Provider(s): na Reason for Treatment: na Does patient have an ACCT team?: No Does patient have Intensive In-House Services?  : No Does patient have [facility]  services? : No Does patient have P4CC services?: No  Past Medical History:  Past Medical History:  Diagnosis Date  . Asthma   . Dysrhythmia   . Hx of chlamydia infection     Past Surgical History:  Procedure Laterality Date  . NO PAST SURGERIES     Family History:  Family History  Problem Relation Age of Onset  . Polydactyly Paternal Uncle   . Sickle cell anemia Cousin   . Diabetes Mother   . Diabetes Maternal Aunt   . Diabetes Maternal Grandmother   . Diabetes Maternal Grandfather    Family Psychiatric  History: none Social History:  History  Alcohol Use  . Yes    Comment: Occas. prior to pregnancy     History  Drug Use No    Social History   Social History  . Marital status: Single    Spouse name: N/A  . Number of children: N/A  . Years of education: N/A   Social History Main Topics  . Smoking status: Former Smoker    Quit date: 10/20/2010  . Smokeless tobacco: Never Used  . Alcohol use Yes     Comment: Occas. prior to pregnancy  . Drug use: No  . Sexual activity: Yes   Other Topics Concern  . Not on file   Social History Narrative  . No narrative on file   Additional Social History:    Allergies:   Allergies  Allergen Reactions  . Aspirin Shortness Of Breath    Patient told not to take aspirin due to respiratory distress.  . Coconut Oil Anaphylaxis  . Lemon Balm [Melissa Officinalis] Hives  . Copper-Containing   Compounds Rash    Labs:  Results for orders placed or performed during the hospital encounter of 02/14/17 (from the past 48 hour(s))  CBC with Differential     Status: Abnormal   Collection Time: 02/14/17 10:28 AM  Result Value Ref Range   WBC 5.3 4.0 - 10.5 K/uL   RBC 4.25 3.87 - 5.11 MIL/uL   Hemoglobin 11.8 (L) 12.0 - 15.0 g/dL   HCT 34.9 (L) 36.0 - 46.0 %   MCV 82.1 78.0 - 100.0 fL   MCH 27.8 26.0 - 34.0 pg   MCHC 33.8 30.0 - 36.0 g/dL   RDW 13.9 11.5 - 15.5 %   Platelets 327 150 - 400 K/uL   Neutrophils Relative % 54 %    Neutro Abs 2.8 1.7 - 7.7 K/uL   Lymphocytes Relative 39 %   Lymphs Abs 2.1 0.7 - 4.0 K/uL   Monocytes Relative 6 %   Monocytes Absolute 0.3 0.1 - 1.0 K/uL   Eosinophils Relative 1 %   Eosinophils Absolute 0.1 0.0 - 0.7 K/uL   Basophils Relative 0 %   Basophils Absolute 0.0 0.0 - 0.1 K/uL  Basic metabolic panel     Status: None   Collection Time: 02/14/17 10:28 AM  Result Value Ref Range   Sodium 139 135 - 145 mmol/L   Potassium 3.5 3.5 - 5.1 mmol/L   Chloride 104 101 - 111 mmol/L   CO2 28 22 - 32 mmol/L   Glucose, Bld 96 65 - 99 mg/dL   BUN 13 6 - 20 mg/dL   Creatinine, Ser 0.78 0.44 - 1.00 mg/dL   Calcium 9.4 8.9 - 10.3 mg/dL   GFR calc non Af Amer >60 >60 mL/min   GFR calc Af Amer >60 >60 mL/min    Comment: (NOTE) The eGFR has been calculated using the CKD EPI equation. This calculation has not been validated in all clinical situations. eGFR's persistently <60 mL/min signify possible Chronic Kidney Disease.    Anion gap 7 5 - 15  Ethanol     Status: None   Collection Time: 02/14/17 10:28 AM  Result Value Ref Range   Alcohol, Ethyl (B) <5 <5 mg/dL    Comment:        LOWEST DETECTABLE LIMIT FOR SERUM ALCOHOL IS 5 mg/dL FOR MEDICAL PURPOSES ONLY   Rapid urine drug screen (hospital performed)     Status: None   Collection Time: 02/14/17 10:28 AM  Result Value Ref Range   Opiates NONE DETECTED NONE DETECTED   Cocaine NONE DETECTED NONE DETECTED   Benzodiazepines NONE DETECTED NONE DETECTED   Amphetamines NONE DETECTED NONE DETECTED   Tetrahydrocannabinol NONE DETECTED NONE DETECTED   Barbiturates NONE DETECTED NONE DETECTED    Comment:        DRUG SCREEN FOR MEDICAL PURPOSES ONLY.  IF CONFIRMATION IS NEEDED FOR ANY PURPOSE, NOTIFY LAB WITHIN 5 DAYS.        LOWEST DETECTABLE LIMITS FOR URINE DRUG SCREEN Drug Class       Cutoff (ng/mL) Amphetamine      1000 Barbiturate      200 Benzodiazepine   200 Tricyclics       300 Opiates          300 Cocaine           300 THC              50   I-Stat Beta hCG blood, ED ([facility], [facility], [facility] only)       Status: None   Collection Time: 02/14/17 11:06 AM  Result Value Ref Range   I-stat hCG, quantitative <5.0 <5 mIU/mL   Comment 3            Comment:   GEST. AGE      CONC.  (mIU/mL)   <=1 WEEK        5 - 50     2 WEEKS       50 - 500     3 WEEKS       100 - 10,000     4 WEEKS     1,000 - 30,000        FEMALE AND NON-PREGNANT FEMALE:     LESS THAN 5 mIU/mL     Current Facility-Administered Medications  Medication Dose Route Frequency Provider Last Rate Last Dose  . acetaminophen (TYLENOL) tablet 650 mg  650 mg Oral Q4H PRN Kirichenko, Tatyana, PA-C      . alum & mag hydroxide-simeth (MAALOX/MYLANTA) 200-200-20 MG/5ML suspension 30 mL  30 mL Oral Q6H PRN Kirichenko, Tatyana, PA-C      . hydrOXYzine (ATARAX/VISTARIL) tablet 25 mg  25 mg Oral TID PRN Kerrie Buffalo, NP   25 mg at 02/14/17 1409  . ondansetron (ZOFRAN) tablet 4 mg  4 mg Oral Q8H PRN Kirichenko, Tatyana, PA-C      . traZODone (DESYREL) tablet 50 mg  50 mg Oral QHS PRN Kerrie Buffalo, NP       Current Outpatient Prescriptions  Medication Sig Dispense Refill  . cyclobenzaprine (FLEXERIL) 5 MG tablet Take 0.5-1 mg by mouth as needed.  0  . meloxicam (MOBIC) 7.5 MG tablet Take 7.5 mg by mouth 2 (two) times daily with a meal.  1  . Prenatal Vit-Fe Fumarate-FA (PRENATAL MULTIVITAMIN) TABS tablet Take 1 tablet by mouth daily at 12 noon.    . triamcinolone cream (KENALOG) 0.1 % Apply 1 application topically 2 (two) times daily.  1    Musculoskeletal: Strength & Muscle Tone: within normal limits Gait & Station: normal Patient leans: N/A  Psychiatric Specialty Exam: Physical Exam  Constitutional: She is oriented to person, place, and time. She appears well-developed and well-nourished.  HENT:  Head: Normocephalic.  Neck: Normal range of motion.  Respiratory: Effort normal.  Musculoskeletal: Normal range of motion.  Neurological: She is alert and  oriented to person, place, and time.  Psychiatric: Her speech is normal and behavior is normal. Judgment and thought content normal. Cognition and memory are normal. She exhibits a depressed mood.    Review of Systems  Psychiatric/Behavioral: Positive for depression.  All other systems reviewed and are negative.   Blood pressure 111/65, pulse 74, temperature 98 F (36.7 C), temperature source Oral, resp. rate 18, SpO2 91 %, unknown if currently breastfeeding.There is no height or weight on file to calculate BMI.  General Appearance: Casual  Eye Contact:  Good  Speech:  Normal Rate  Volume:  Normal  Mood:  Depressed, mild  Affect:  Congruent  Thought Process:  Coherent and Descriptions of Associations: Intact  Orientation:  Full (Time, Place, and Person)  Thought Content:  WDL and Logical  Suicidal Thoughts:  No  Homicidal Thoughts:  No  Memory:  Immediate;   Good Recent;   Good Remote;   Good  Judgement:  Good  Insight:  Good  Psychomotor Activity:  Normal  Concentration:  Concentration: Good and Attention Span: Good  Recall:  Good  Fund of Knowledge:  Good  Language:  Good  Akathisia:  No  Handed:  Right  AIMS (if indicated):     Assets:  Leisure Time Physical Health Resilience Social Support  ADL's:  Intact  Cognition:  WNL  Sleep:        Treatment Plan Summary: Daily contact with patient to assess and evaluate symptoms and progress in treatment, Medication management and Plan adjustmetn disorder with depressed mood:  -Crisis stabilization -Medication management:  Vistaril 25 mg TID PRN anxiety and Trazodone 50 mg PRN sleep started -Individual counseling  Disposition: No evidence of imminent risk to self or others at present.    LORD, JAMISON, NP 02/15/2017 10:55 AM  Patient seen face-to-face for psychiatric evaluation, chart reviewed and case discussed with the physician extender and developed treatment plan. Reviewed the information documented and agree with the  treatment plan. Mojeed Akintayo, MD 

## 2017-02-15 NOTE — Discharge Instructions (Signed)
For your ongoing mental health needs, you are advised to follow up with the [facility]. Call for an Appointment Call [facility] directly at [phone] or [phone]. Listen to menu options and select the one for scheduling appointments. ? [facility] office: [place] [address] [place], [place] [address] Ph. [phone] Fax. [phone]

## 2017-02-15 NOTE — ED Notes (Signed)
Pt stated "my son's father and I aren't in a good relationship right now.  My son is 38, my mom has him in [place].  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  I'm taking Statistics and A&PII.  I may need to rethink getting my MS in Psychology and forget about nursing.  The Chaplain came down and stayed with me through the admission process and just talking to her helped me a lot."

## 2017-03-03 DIAGNOSIS — N926 Irregular menstruation, unspecified: Secondary | ICD-10-CM | POA: Diagnosis not present

## 2017-03-03 DIAGNOSIS — N912 Amenorrhea, unspecified: Secondary | ICD-10-CM | POA: Diagnosis not present

## 2017-04-09 DIAGNOSIS — F329 Major depressive disorder, single episode, unspecified: Secondary | ICD-10-CM | POA: Diagnosis not present

## 2017-04-09 DIAGNOSIS — F419 Anxiety disorder, unspecified: Secondary | ICD-10-CM | POA: Diagnosis not present

## 2017-05-28 DIAGNOSIS — F419 Anxiety disorder, unspecified: Secondary | ICD-10-CM | POA: Diagnosis not present

## 2017-05-28 DIAGNOSIS — B373 Candidiasis of vulva and vagina: Secondary | ICD-10-CM | POA: Diagnosis not present

## 2017-05-28 DIAGNOSIS — F329 Major depressive disorder, single episode, unspecified: Secondary | ICD-10-CM | POA: Diagnosis not present

## 2017-06-11 DIAGNOSIS — L219 Seborrheic dermatitis, unspecified: Secondary | ICD-10-CM | POA: Diagnosis not present

## 2017-06-11 DIAGNOSIS — Z23 Encounter for immunization: Secondary | ICD-10-CM | POA: Diagnosis not present

## 2017-06-11 DIAGNOSIS — D224 Melanocytic nevi of scalp and neck: Secondary | ICD-10-CM | POA: Diagnosis not present

## 2017-08-27 DIAGNOSIS — F419 Anxiety disorder, unspecified: Secondary | ICD-10-CM | POA: Diagnosis not present

## 2017-08-27 DIAGNOSIS — F4329 Adjustment disorder with other symptoms: Secondary | ICD-10-CM | POA: Diagnosis not present

## 2017-08-27 DIAGNOSIS — F329 Major depressive disorder, single episode, unspecified: Secondary | ICD-10-CM | POA: Diagnosis not present

## 2017-09-15 DIAGNOSIS — B373 Candidiasis of vulva and vagina: Secondary | ICD-10-CM | POA: Diagnosis not present

## 2017-09-15 DIAGNOSIS — H5213 Myopia, bilateral: Secondary | ICD-10-CM | POA: Diagnosis not present

## 2017-09-15 DIAGNOSIS — Z09 Encounter for follow-up examination after completed treatment for conditions other than malignant neoplasm: Secondary | ICD-10-CM | POA: Diagnosis not present

## 2017-09-20 ENCOUNTER — Ambulatory Visit: Payer: Self-pay | Admitting: Nurse Practitioner

## 2017-09-20 VITALS — BP 100/62 | HR 98 | Temp 99.1°F | Wt 141.8 lb

## 2017-09-20 DIAGNOSIS — B9789 Other viral agents as the cause of diseases classified elsewhere: Secondary | ICD-10-CM

## 2017-09-20 DIAGNOSIS — J028 Acute pharyngitis due to other specified organisms: Secondary | ICD-10-CM

## 2017-09-20 DIAGNOSIS — Z20828 Contact with and (suspected) exposure to other viral communicable diseases: Secondary | ICD-10-CM

## 2017-09-20 LAB — POCT INFLUENZA A/B
Influenza A, POC: NEGATIVE
Influenza B, POC: NEGATIVE

## 2017-09-20 NOTE — Patient Instructions (Addendum)

## 2017-09-20 NOTE — Progress Notes (Signed)
Subjective:     Dawn Zamora is a 27 y.o. female who presents for evaluation of sore throat. Associated symptoms include dry cough and sore throat. Onset of symptoms was 1 day ago, and have been unchanged since that time. She is drinking plenty of fluids. She has not had a recent close exposure to someone with proven streptococcal pharyngitis, but son was dxed with influenza.  Patient has not taken any medications for her symptoms.   The following portions of the patient's history were reviewed and updated as appropriate: allergies, current medications and past medical history.  Review of Systems Constitutional: negative Eyes: negative Ears, nose, mouth, throat, and face: positive for sore throat, negative for ear drainage, earaches and hoarseness Respiratory: positive for cough Cardiovascular: negative Gastrointestinal: negative Neurological: negative    Objective:    BP 100/62   Pulse 98   Temp 99.1 F (37.3 C)   Wt 141 lb 12.8 oz (64.3 kg)   SpO2 98%   BMI 24.34 kg/m  General appearance: alert, cooperative and no distress Head: Normocephalic, without obvious abnormality, atraumatic Eyes: conjunctivae/corneas clear. PERRL, EOM's intact. Fundi benign. Ears: normal TM and external ear canal right ear and abnormal TM left ear - erythematous Nose: no discharge, turbinates swollen, inflamed Throat: abnormal findings: mild oropharyngeal erythema Lungs: clear to auscultation bilaterally Heart: regular rate and rhythm, S1, S2 normal, no murmur, click, rub or gallop Abdomen: soft, non-tender; bowel sounds normal; no masses,  no organomegaly Pulses: 2+ and symmetric Skin: Skin color, texture, turgor normal. No rashes or lesions Lymph nodes: cervical nodes normal Neurologic: Grossly normal  Laboratory Strep test done. Results:negative.   Influenza test performed: negative   Assessment:    Acute pharyngitis, likely  Viral pharyngitis.    Plan:    Use of OTC analgesics  recommended as well as salt water gargles. Use of decongestant recommended. Follow up as needed. Warm tea with honey or ice chips for throat pain.   Continue to monitor symptoms.  If symptoms worse, need follow up.

## 2017-09-22 DIAGNOSIS — J069 Acute upper respiratory infection, unspecified: Secondary | ICD-10-CM | POA: Diagnosis not present

## 2017-09-22 DIAGNOSIS — T378X5A Adverse effect of other specified systemic anti-infectives and antiparasitics, initial encounter: Secondary | ICD-10-CM | POA: Diagnosis not present

## 2017-09-22 DIAGNOSIS — R6889 Other general symptoms and signs: Secondary | ICD-10-CM | POA: Diagnosis not present

## 2017-10-20 DIAGNOSIS — L309 Dermatitis, unspecified: Secondary | ICD-10-CM | POA: Diagnosis not present

## 2017-10-20 DIAGNOSIS — L700 Acne vulgaris: Secondary | ICD-10-CM | POA: Diagnosis not present

## 2017-11-17 ENCOUNTER — Emergency Department (HOSPITAL_COMMUNITY)
Admission: EM | Admit: 2017-11-17 | Discharge: 2017-11-17 | Disposition: A | Discharge status: Home / Self Care | Payer: No Typology Code available for payment source | Attending: Emergency Medicine | Admitting: Emergency Medicine

## 2017-11-17 ENCOUNTER — Encounter (HOSPITAL_COMMUNITY): Payer: Self-pay | Admitting: Emergency Medicine

## 2017-11-17 DIAGNOSIS — S0990XA Unspecified injury of head, initial encounter: Secondary | ICD-10-CM | POA: Insufficient documentation

## 2017-11-17 DIAGNOSIS — J45909 Unspecified asthma, uncomplicated: Secondary | ICD-10-CM | POA: Diagnosis not present

## 2017-11-17 DIAGNOSIS — Z87891 Personal history of nicotine dependence: Secondary | ICD-10-CM | POA: Insufficient documentation

## 2017-11-17 DIAGNOSIS — Z79899 Other long term (current) drug therapy: Secondary | ICD-10-CM | POA: Diagnosis not present

## 2017-11-17 DIAGNOSIS — Y9389 Activity, other specified: Secondary | ICD-10-CM | POA: Diagnosis not present

## 2017-11-17 DIAGNOSIS — W228XXA Striking against or struck by other objects, initial encounter: Secondary | ICD-10-CM | POA: Insufficient documentation

## 2017-11-17 DIAGNOSIS — Y929 Unspecified place or not applicable: Secondary | ICD-10-CM | POA: Insufficient documentation

## 2017-11-17 DIAGNOSIS — Y990 Civilian activity done for income or pay: Secondary | ICD-10-CM | POA: Insufficient documentation

## 2017-11-17 DIAGNOSIS — R51 Headache: Secondary | ICD-10-CM | POA: Diagnosis present

## 2017-11-17 NOTE — ED Notes (Signed)
ED Provider at bedside. 

## 2017-11-17 NOTE — ED Triage Notes (Signed)
Pt reports she was at her job when self fell on her head and knocker her out. C/o head pain with blurred vision. Denies any n/v

## 2017-11-19 NOTE — ED Provider Notes (Signed)
[facility] DEPT Provider Note   CSN: [id number] Arrival date & time: 11/17/17  1033     History   Chief Complaint Chief Complaint  Patient presents with  . Head Injury    HPI Dawn Zamora is a 27 y.o. female.  HPI  27 year old female with head injury.  Patient states that she was at her place of work and bent over.  As she went to stand up she struck her head.  She states that she was knocked out briefly.  She complained of a headache at the site where she struck her head in the right occipital parietal region.  Some blurred vision initially was which has since improved.  No nausea or vomiting.  Has been ambulatory since then without any difficulty.  No acute numbness, tingling focal loss of strength.  Past Medical History:  Diagnosis Date  . Asthma   . Dysrhythmia   . Hx of chlamydia infection     Patient Active Problem List   Diagnosis Date Noted  . Adjustment disorder with depressed mood 02/15/2017  . NSVD (normal spontaneous vaginal delivery) 01/07/2014  . Normal labor 01/06/2014    Past Surgical History:  Procedure Laterality Date  . NO PAST SURGERIES       OB History    Gravida  2   Para  1   Term  1   Preterm      AB  1   Living  1     SAB      TAB  1   Ectopic      Multiple      Live Births  1            Home Medications    Prior to Admission medications   Medication Sig Start Date End Date Taking? Authorizing Provider  adapalene (DIFFERIN) 0.1 % gel APPLY TO AFFECTED AREA ONCE A DAY AT BEDTIME 10/20/17  Yes [provider]  triamcinolone cream (KENALOG) 0.1 % Apply 1 application topically 2 (two) times daily. 11/27/16  Yes [provider]  hydrOXYzine (ATARAX/VISTARIL) 25 MG tablet Take 1 tablet (25 mg total) by mouth 3 (three) times daily as needed for anxiety. Patient not taking: Reported on 11/17/2017 02/15/17   Patrecia Pour, NP  traZODone (DESYREL) 50 MG tablet Take 1 tablet (50  mg total) by mouth at bedtime as needed for sleep. Patient not taking: Reported on 11/17/2017 02/15/17   Patrecia Pour, NP    Family History Family History  Problem Relation Age of Onset  . Polydactyly Paternal Uncle   . Sickle cell anemia Cousin   . Diabetes Mother   . Diabetes Maternal Aunt   . Diabetes Maternal Grandmother   . Diabetes Maternal Grandfather     Social History Social History   Tobacco Use  . Smoking status: Former Smoker    Last attempt to quit: 10/20/2010    Years since quitting: 7.0  . Smokeless tobacco: Never Used  Substance Use Topics  . Alcohol use: Yes    Comment: Occas. prior to pregnancy  . Drug use: No     Allergies   Aspirin; Coconut oil; Lemon balm [melissa officinalis]; Nickel; and Copper-containing compounds   Review of Systems Review of Systems  All systems reviewed and negative, other than as noted in HPI.  Physical Exam Updated Vital Signs BP 112/79 (BP Location: Right Arm)   Pulse 65   Resp 17   Ht 5\' 3"  (1.6 m)  Wt 61.7 kg (136 lb)   LMP 11/03/2017   SpO2 100%   BMI 24.09 kg/m   Physical Exam  Constitutional: She is oriented to person, place, and time. She appears well-developed and well-nourished. No distress.  HENT:  Head: Normocephalic and atraumatic.  No significant hematoma or break in skin noted in the right occipitoparietal region where she states she struck her head.  Eyes: Conjunctivae are normal. Right eye exhibits no discharge. Left eye exhibits no discharge.  Neck: Neck supple.  Cardiovascular: Normal rate, regular rhythm and normal heart sounds. Exam reveals no gallop and no friction rub.  No murmur heard. Pulmonary/Chest: Effort normal and breath sounds normal. No respiratory distress.  Abdominal: Soft. She exhibits no distension. There is no tenderness.  Musculoskeletal: She exhibits no edema or tenderness.  Neurological: She is alert and oriented to person, place, and time. No cranial nerve deficit. She  exhibits normal muscle tone. Coordination normal.  Skin: Skin is warm and dry.  Psychiatric: She has a normal mood and affect. Her behavior is normal. Thought content normal.  Nursing note and vitals reviewed.    ED Treatments / Results  Labs (all labs ordered are listed, but only abnormal results are displayed) Labs Reviewed - No data to display  EKG None  Radiology No results found.  Procedures Procedures (including critical care time)  Medications Ordered in ED Medications - No data to display   Initial Impression / Assessment and Plan / ED Course  I have reviewed the triage vital signs and the nursing notes.  Pertinent labs & imaging results that were available during my care of the patient were reviewed by me and considered in my medical decision making (see chart for details).     27 year old female with minor head injury.  She does report a loss of consciousness but really no other concerning features otherwise.  Her neuro exam is nonfocal.  I doubt skull fracture or head bleed.  Imaging was deferred.  Head injury instructions were discussed.  Outpatient follow-up as needed otherwise.  Final Clinical Impressions(s) / ED Diagnoses   Final diagnoses:  Injury of head, initial encounter    ED Discharge Orders    None       Virgel Manifold, MD 11/19/17 1728

## 2017-12-03 DIAGNOSIS — Z113 Encounter for screening for infections with a predominantly sexual mode of transmission: Secondary | ICD-10-CM | POA: Diagnosis not present

## 2017-12-03 DIAGNOSIS — Z01419 Encounter for gynecological examination (general) (routine) without abnormal findings: Secondary | ICD-10-CM | POA: Diagnosis not present

## 2017-12-03 DIAGNOSIS — N898 Other specified noninflammatory disorders of vagina: Secondary | ICD-10-CM | POA: Diagnosis not present

## 2017-12-03 DIAGNOSIS — M545 Low back pain: Secondary | ICD-10-CM | POA: Diagnosis not present

## 2017-12-03 DIAGNOSIS — Z6825 Body mass index (BMI) 25.0-25.9, adult: Secondary | ICD-10-CM | POA: Diagnosis not present

## 2017-12-03 DIAGNOSIS — Z124 Encounter for screening for malignant neoplasm of cervix: Secondary | ICD-10-CM | POA: Diagnosis not present

## 2018-03-17 DIAGNOSIS — Z3009 Encounter for other general counseling and advice on contraception: Secondary | ICD-10-CM | POA: Diagnosis not present

## 2018-03-17 DIAGNOSIS — N926 Irregular menstruation, unspecified: Secondary | ICD-10-CM | POA: Diagnosis not present

## 2018-04-06 DIAGNOSIS — F4329 Adjustment disorder with other symptoms: Secondary | ICD-10-CM | POA: Diagnosis not present

## 2018-04-06 DIAGNOSIS — Z0000 Encounter for general adult medical examination without abnormal findings: Secondary | ICD-10-CM | POA: Diagnosis not present

## 2018-04-06 DIAGNOSIS — N926 Irregular menstruation, unspecified: Secondary | ICD-10-CM | POA: Diagnosis not present

## 2018-04-06 DIAGNOSIS — Z13220 Encounter for screening for lipoid disorders: Secondary | ICD-10-CM | POA: Diagnosis not present

## 2018-04-06 DIAGNOSIS — Z136 Encounter for screening for cardiovascular disorders: Secondary | ICD-10-CM | POA: Diagnosis not present

## 2018-04-06 DIAGNOSIS — F419 Anxiety disorder, unspecified: Secondary | ICD-10-CM | POA: Diagnosis not present

## 2018-04-06 DIAGNOSIS — Z130 Encounter for screening for diseases of the blood and blood-forming organs and certain disorders involving the immune mechanism: Secondary | ICD-10-CM | POA: Diagnosis not present

## 2018-07-25 ENCOUNTER — Encounter (HOSPITAL_COMMUNITY): Payer: Self-pay | Admitting: Emergency Medicine

## 2018-07-25 ENCOUNTER — Emergency Department (HOSPITAL_COMMUNITY)
Admission: EM | Admit: 2018-07-25 | Discharge: 2018-07-25 | Disposition: A | Discharge status: Home / Self Care | Payer: 59 | Attending: Emergency Medicine | Admitting: Emergency Medicine

## 2018-07-25 ENCOUNTER — Other Ambulatory Visit: Payer: Self-pay

## 2018-07-25 DIAGNOSIS — Z79899 Other long term (current) drug therapy: Secondary | ICD-10-CM | POA: Insufficient documentation

## 2018-07-25 DIAGNOSIS — N939 Abnormal uterine and vaginal bleeding, unspecified: Secondary | ICD-10-CM | POA: Insufficient documentation

## 2018-07-25 DIAGNOSIS — Z87891 Personal history of nicotine dependence: Secondary | ICD-10-CM | POA: Insufficient documentation

## 2018-07-25 DIAGNOSIS — J45909 Unspecified asthma, uncomplicated: Secondary | ICD-10-CM | POA: Insufficient documentation

## 2018-07-25 LAB — I-STAT CHEM 8, ED
BUN: 8 mg/dL (ref 6–20)
Calcium, Ion: 1.07 mmol/L — ABNORMAL LOW (ref 1.15–1.40)
Chloride: 104 mmol/L (ref 98–111)
Creatinine, Ser: 0.7 mg/dL (ref 0.44–1.00)
Glucose, Bld: 100 mg/dL — ABNORMAL HIGH (ref 70–99)
HCT: 36 % (ref 36.0–46.0)
Hemoglobin: 12.2 g/dL (ref 12.0–15.0)
Potassium: 3.2 mmol/L — ABNORMAL LOW (ref 3.5–5.1)
Sodium: 140 mmol/L (ref 135–145)
TCO2: 25 mmol/L (ref 22–32)

## 2018-07-25 LAB — I-STAT BETA HCG BLOOD, ED (MC, WL, AP ONLY): I-stat hCG, quantitative: <5 m[IU]/mL (ref <5)

## 2018-07-25 NOTE — ED Provider Notes (Signed)
[facility] Provider Note   CSN: [id number] Arrival date & time: 07/25/18  2124     History   Chief Complaint Chief Complaint  Patient presents with  . Vaginal Bleeding    HPI Dawn [facility] is a 27 y.o. female.  The history is provided by the patient. No language interpreter was used.  Vaginal Bleeding  Primary symptoms include vaginal bleeding.  Primary symptoms include no discharge, no pelvic pain, no dyspareunia, no genital itching, no genital odor, no dysuria. There has been no fever. This is a new problem. Episode frequency: just started a few hours ago. The symptoms occur at rest. She is not pregnant. She has not missed her period. Her LMP was weeks ago. The patient's menstrual history has been regular. Pertinent negatives include no anorexia, no abdominal swelling, no abdominal pain, no constipation, no diarrhea, no nausea, no vomiting, no frequency and no dizziness. She has tried nothing for the symptoms. The treatment provided no relief. Sexual activity: sexually active. There is no concern regarding sexually transmitted diseases. She uses nothing for contraception. Associated medical issues include herpes simplex. Associated medical issues do not include STD, PID, hypermenorrhea, endometriosis or miscarriage.    Past Medical History:  Diagnosis Date  . Asthma   . Dysrhythmia   . Hx of chlamydia infection     Patient Active Problem List   Diagnosis Date Noted  . Adjustment disorder with depressed mood 02/15/2017  . NSVD (normal spontaneous vaginal delivery) 01/07/2014  . Normal labor 01/06/2014    Past Surgical History:  Procedure Laterality Date  . NO PAST SURGERIES       OB History    Gravida  2   Para  1   Term  1   Preterm      AB  1   Living  1     SAB      TAB  1   Ectopic      Multiple      Live Births  1            Home Medications    Prior to Admission medications   Medication Sig  Start Date End Date Taking? Authorizing Provider  adapalene (DIFFERIN) 0.1 % gel APPLY TO AFFECTED AREA ONCE A DAY AT BEDTIME 10/20/17   [provider]  hydrOXYzine (ATARAX/VISTARIL) 25 MG tablet Take 1 tablet (25 mg total) by mouth 3 (three) times daily as needed for anxiety. Patient not taking: Reported on 11/17/2017 02/15/17   Patrecia Pour, NP  traZODone (DESYREL) 50 MG tablet Take 1 tablet (50 mg total) by mouth at bedtime as needed for sleep. Patient not taking: Reported on 11/17/2017 02/15/17   Patrecia Pour, NP  triamcinolone cream (KENALOG) 0.1 % Apply 1 application topically 2 (two) times daily. 11/27/16   [provider]    Family History Family History  Problem Relation Age of Onset  . Polydactyly Paternal Uncle   . Sickle cell anemia Cousin   . Diabetes Mother   . Diabetes Maternal Aunt   . Diabetes Maternal Grandmother   . Diabetes Maternal Grandfather     Social History Social History   Tobacco Use  . Smoking status: Former Smoker    Last attempt to quit: 10/20/2010    Years since quitting: 7.7  . Smokeless tobacco: Never Used  Substance Use Topics  . Alcohol use: Yes    Comment: Occas. prior to pregnancy  . Drug use: No  Allergies   Aspirin; Coconut oil; Lemon balm [melissa officinalis]; Nickel; and Copper-containing compounds   Review of Systems Review of Systems  Constitutional: Negative for chills and fever.  HENT: Negative for ear pain and sore throat.   Eyes: Negative for pain and visual disturbance.  Respiratory: Negative for cough and shortness of breath.   Cardiovascular: Negative for chest pain and palpitations.  Gastrointestinal: Negative for abdominal pain, anorexia, constipation, diarrhea, nausea and vomiting.  Genitourinary: Positive for menstrual problem and vaginal bleeding. Negative for decreased urine volume, difficulty urinating, dyspareunia, dysuria, flank pain, frequency, hematuria, pelvic pain, urgency, vaginal discharge  and vaginal pain.  Musculoskeletal: Negative for arthralgias and back pain.  Skin: Negative for color change and rash.  Neurological: Negative for dizziness, seizures and syncope.  All other systems reviewed and are negative.    Physical Exam Updated Vital Signs BP 103/79 (BP Location: Right Arm)   Pulse 63   Temp 97.8 F (36.6 C) (Oral)   Resp 12   Ht 5\' 3"  (1.6 m)   Wt 56.2 kg   SpO2 100%   BMI 21.97 kg/m   Physical Exam  Constitutional: She appears well-developed and well-nourished. No distress.  HENT:  Head: Normocephalic and atraumatic.  Eyes: Conjunctivae are normal.  Neck: Neck supple.  Cardiovascular: Normal rate and regular rhythm.  No murmur heard. Pulmonary/Chest: Effort normal and breath sounds normal. No respiratory distress.  Abdominal: Soft. There is no tenderness.  Musculoskeletal: She exhibits no edema.  Neurological: She is alert.  Skin: Skin is warm and dry.  Psychiatric: She has a normal mood and affect.  Nursing note and vitals reviewed.    ED Treatments / Results  Labs (all labs ordered are listed, but only abnormal results are displayed) Labs Reviewed  I-STAT CHEM 8, ED  I-STAT BETA HCG BLOOD, ED ([facility], [facility], [facility] ONLY)    EKG None  Radiology No results found.  Procedures Procedures (including critical care time)  Medications Ordered in ED Medications - No data to display  Initial Impression / Assessment and Plan / ED Course  I have reviewed the triage vital signs and the nursing notes.  Pertinent labs & imaging results that were available during my care of the patient were reviewed by me and considered in my medical decision making (see chart for details).     Patient is a 27 y.o. female with PMHx of none presenting for vaginal bleeding that started today. Patient states that she has a regular period and has never had bleeding outside. Denies abdominal pain, no cramping, no nausea/vomiting, shortness of breath or fatigue. B-hcg is  negative, Hgb normal.  Unlikely to be ectopic pregnancy, patient is not pregnant. Unlikely to be STD, patient denies abdominal pain and vaginal discharge.  Patient will be discharge will strict return precautions. All questions are answered.   Final Clinical Impressions(s) / ED Diagnoses   Final diagnoses:  Vaginal bleeding    ED Discharge Orders    None       Erskine Squibb, MD 07/25/18 1443    Noemi Chapel, MD 07/25/18 [phone]

## 2018-07-25 NOTE — ED Triage Notes (Signed)
Pt states she finished her menstrual cycle on 12/1 but today started having vaginal bleeding today, soaking through two pairs of underwear. Pt states her periods are regular. Pt has no pain but has had weakness for the past two days.

## 2018-08-25 ENCOUNTER — Other Ambulatory Visit: Payer: Self-pay

## 2018-08-25 ENCOUNTER — Encounter: Payer: Self-pay | Admitting: Emergency Medicine

## 2018-08-25 ENCOUNTER — Emergency Department (HOSPITAL_COMMUNITY)
Admission: EM | Admit: 2018-08-25 | Discharge: 2018-08-25 | Disposition: A | Discharge status: Home / Self Care | Payer: Self-pay | Attending: Emergency Medicine | Admitting: Emergency Medicine

## 2018-08-25 DIAGNOSIS — Z79899 Other long term (current) drug therapy: Secondary | ICD-10-CM | POA: Insufficient documentation

## 2018-08-25 DIAGNOSIS — Z87891 Personal history of nicotine dependence: Secondary | ICD-10-CM | POA: Insufficient documentation

## 2018-08-25 DIAGNOSIS — N939 Abnormal uterine and vaginal bleeding, unspecified: Secondary | ICD-10-CM | POA: Insufficient documentation

## 2018-08-25 DIAGNOSIS — J45909 Unspecified asthma, uncomplicated: Secondary | ICD-10-CM | POA: Insufficient documentation

## 2018-08-25 LAB — I-STAT CHEM 8, ED
BUN: 10 mg/dL (ref 6–20)
Calcium, Ion: 1.25 mmol/L (ref 1.15–1.40)
Chloride: 101 mmol/L (ref 98–111)
Creatinine, Ser: 0.8 mg/dL (ref 0.44–1.00)
Glucose, Bld: 95 mg/dL (ref 70–99)
HCT: 35 % — ABNORMAL LOW (ref 36.0–46.0)
Hemoglobin: 11.9 g/dL — ABNORMAL LOW (ref 12.0–15.0)
Potassium: 3.4 mmol/L — ABNORMAL LOW (ref 3.5–5.1)
Sodium: 141 mmol/L (ref 135–145)
TCO2: 29 mmol/L (ref 22–32)

## 2018-08-25 LAB — I-STAT BETA HCG BLOOD, ED (MC, WL, AP ONLY): I-stat hCG, quantitative: <5 m[IU]/mL (ref <5)

## 2018-08-25 MED ORDER — NORETHINDRONE ACETATE 5 MG PO TABS: 5.0000 mg | ORAL_TABLET | Freq: Every day | ORAL | 0 refills | Status: DC

## 2018-08-25 NOTE — ED Provider Notes (Signed)
[facility] EMERGENCY DEPARTMENT Provider Note   CSN: [id number] Arrival date & time: 08/25/18  1742     History   Chief Complaint Chief Complaint  Patient presents with  . Vaginal Bleeding    HPI [facility] is a 28 y.o. female.  The history is provided by the patient and medical records. No language interpreter was used.  Vaginal Bleeding  Associated symptoms: no dysuria and no vaginal discharge    [facility] is a 28 y.o. female  with a PMH as listed below who presents to the Emergency Department complaining of vaginal bleeding.  Patient states that she had a normal menstrual cycle in November.  She then started spotting which progressed to heavy bleeding about 2 weeks after her menstrual cycle.  She was seen in the emergency department at that time.  She was told her pregnancy test was negative and her lab work looked fine and she should follow-up with an OB/GYN.  She does have an OB/GYN, however did not follow-up with them.  She states that she waited 2 weeks and then took a pregnancy test.  She thinks that it may have been positive.  There was a very faint positive line.  She let her boyfriend look at it as well and he thought that it could be positive.  She did not take another one.  A few days later, she started bleeding again.  She reports that she typically goes through a tampon about every 3-4 hours, but has been going through 1 every 2 hours for the last couple of days now.  She denies any abnormal discharge, but does report passing a few small clots which she stated were large. States they were smaller than a pea-sized.  She has never experienced abnormal menstrual bleeding in the past.  She reports some right lower quadrant pain at the onset of her symptoms a few days ago, currently not having any abdominal pain.  No nausea or vomiting.  No back pain.  No urinary symptoms.  No fever or chills.   Past Medical History:  Diagnosis Date  . Asthma   .  Dysrhythmia   . Hx of chlamydia infection     Patient Active Problem List   Diagnosis Date Noted  . Adjustment disorder with depressed mood 02/15/2017  . NSVD (normal spontaneous vaginal delivery) 01/07/2014  . Normal labor 01/06/2014    Past Surgical History:  Procedure Laterality Date  . NO PAST SURGERIES       OB History    Gravida  2   Para  1   Term  1   Preterm      AB  1   Living  1     SAB      TAB  1   Ectopic      Multiple      Live Births  1            Home Medications    Prior to Admission medications   Medication Sig Start Date End Date Taking? Authorizing Provider  albuterol (PROVENTIL HFA;VENTOLIN HFA) 108 (90 Base) MCG/ACT inhaler Inhale 1-2 puffs into the lungs every 6 (six) hours as needed for wheezing or shortness of breath.    [provider]  hydrOXYzine (ATARAX/VISTARIL) 25 MG tablet Take 1 tablet (25 mg total) by mouth 3 (three) times daily as needed for anxiety. Patient not taking: Reported on 11/17/2017 02/15/17   Patrecia Pour, NP  norethindrone (AYGESTIN) 5 MG  tablet Take 1 tablet (5 mg total) by mouth daily. 08/25/18   Ward, Ozella Almond, PA-C  traZODone (DESYREL) 50 MG tablet Take 1 tablet (50 mg total) by mouth at bedtime as needed for sleep. Patient not taking: Reported on 11/17/2017 02/15/17   Patrecia Pour, NP    Family History Family History  Problem Relation Age of Onset  . Polydactyly Paternal Uncle   . Sickle cell anemia Cousin   . Diabetes Mother   . Diabetes Maternal Aunt   . Diabetes Maternal Grandmother   . Diabetes Maternal Grandfather     Social History Social History   Tobacco Use  . Smoking status: Former Smoker    Last attempt to quit: 10/20/2010    Years since quitting: 7.8  . Smokeless tobacco: Never Used  Substance Use Topics  . Alcohol use: Yes    Comment: Occas. prior to pregnancy  . Drug use: No     Allergies   Aspirin; Coconut oil; Lemon balm [melissa officinalis];  Copper-containing compounds; and Nickel   Review of Systems Review of Systems  Genitourinary: Positive for vaginal bleeding. Negative for dysuria, pelvic pain, urgency, vaginal discharge and vaginal pain.  All other systems reviewed and are negative.    Physical Exam Updated Vital Signs BP 115/73 (BP Location: Right Arm)   Pulse 72   Temp 98.2 F (36.8 C) (Oral)   Resp 16   Ht 5\' 3"  (1.6 m)   Wt 54.4 kg   LMP 07/13/2018 (Exact Date)   SpO2 100%   BMI 21.26 kg/m   Physical Exam Vitals signs and nursing note reviewed.  Constitutional:      General: She is not in acute distress.    Appearance: She is well-developed.  HENT:     Head: Normocephalic and atraumatic.  Cardiovascular:     Rate and Rhythm: Normal rate and regular rhythm.     Heart sounds: Normal heart sounds. No murmur.  Pulmonary:     Effort: Pulmonary effort is normal. No respiratory distress.     Breath sounds: Normal breath sounds.  Abdominal:     General: There is no distension.     Palpations: Abdomen is soft.     Comments: No abdominal tenderness.  Skin:    General: Skin is warm and dry.  Neurological:     Mental Status: She is alert and oriented to person, place, and time.      ED Treatments / Results  Labs (all labs ordered are listed, but only abnormal results are displayed) Labs Reviewed  I-STAT CHEM 8, ED - Abnormal; Notable for the following components:      Result Value   Potassium 3.4 (*)    Hemoglobin 11.9 (*)    HCT 35.0 (*)    All other components within normal limits  I-STAT BETA HCG BLOOD, ED ([facility], [facility], [facility] ONLY)    EKG None  Radiology No results found.  Procedures Procedures (including critical care time)  Medications Ordered in ED Medications - No data to display   Initial Impression / Assessment and Plan / ED Course  I have reviewed the triage vital signs and the nursing notes.  Pertinent labs & imaging results that were available during my care of the patient  were reviewed by me and considered in my medical decision making (see chart for details).    [facility] is a 28 y.o. female who presents to ED for recurrent vaginal bleeding.  She had normal menses back in November.  Had some spotting prior to her typical menstrual cycle where she was seen in the emergency department.  Chart reviewed from this encounter.  Has not followed up with OB/GYN.  That bleeding resolved, but she now has started bleeding once again.  She is afebrile and hemodynamically stable.  Benign abdominal exam.  She has had no sxs of anemia.  hemoglobin of 11.9.  Pregnancy test negative.  Strongly encouraged her to follow-up with OB/GYN.  She does have one and can call them tomorrow.  We discussed doing pelvic exam today for further evaluation, however patient would like to defer this until she sees the OB/GYN which I believe is reasonable. Evaluation does not show pathology that would require ongoing emergent intervention or inpatient treatment.  Will give short course of hormonal therapy for ongoing heavy bleeding.  Reasons to return to the emergency department were discussed and all questions answered.  Final Clinical Impressions(s) / ED Diagnoses   Final diagnoses:  Vaginal bleeding    ED Discharge Orders         Ordered    norethindrone (AYGESTIN) 5 MG tablet  Daily     08/25/18 1909           Ward, Ozella Almond, PA-C 08/25/18 1931    Maudie Flakes, MD 08/27/18 1315

## 2018-08-25 NOTE — Discharge Instructions (Addendum)
It was my pleasure taking care of you today!   Take norethrindrone hormone pill daily to help slow down your bleeding.   Call your OBGYN tomorrow to schedule a follow up appointment.   Return to ER for new or worsening symptoms, any additional concerns.

## 2018-08-25 NOTE — ED Triage Notes (Signed)
Patient from home with c/o vaginal bleeding with large clots. Patient states this happened a few months ago as well and she was seen here. Also states she took a pregnancy test and got a faint positive. Also c/o increased fatigue and RLQ pain.

## 2018-09-22 ENCOUNTER — Encounter (HOSPITAL_COMMUNITY): Payer: Self-pay

## 2018-09-28 ENCOUNTER — Encounter (HOSPITAL_COMMUNITY): Payer: Self-pay | Admitting: Emergency Medicine

## 2018-09-28 ENCOUNTER — Other Ambulatory Visit: Payer: Self-pay

## 2018-09-28 ENCOUNTER — Emergency Department (HOSPITAL_COMMUNITY)
Admission: EM | Admit: 2018-09-28 | Discharge: 2018-09-29 | Disposition: A | Discharge status: Home / Self Care | Payer: Self-pay | Attending: Emergency Medicine | Admitting: Emergency Medicine

## 2018-09-28 DIAGNOSIS — R102 Pelvic and perineal pain: Secondary | ICD-10-CM | POA: Insufficient documentation

## 2018-09-28 DIAGNOSIS — Z5321 Procedure and treatment not carried out due to patient leaving prior to being seen by health care provider: Secondary | ICD-10-CM | POA: Insufficient documentation

## 2018-09-28 LAB — URINALYSIS, ROUTINE W REFLEX MICROSCOPIC
Bilirubin Urine: NEGATIVE
Glucose, UA: NEGATIVE mg/dL
Hgb urine dipstick: NEGATIVE
Ketones, ur: 5 mg/dL — AB
Leukocytes,Ua: NEGATIVE
Nitrite: NEGATIVE
Protein, ur: NEGATIVE mg/dL
Specific Gravity, Urine: 1.028 (ref 1.005–1.030)
pH: 7 (ref 5.0–8.0)

## 2018-09-28 NOTE — ED Triage Notes (Signed)
Pt. Stated, [facility] had pelvic pain on the left side, started today.

## 2018-09-29 ENCOUNTER — Encounter (HOSPITAL_COMMUNITY): Payer: Self-pay | Admitting: Emergency Medicine

## 2018-09-29 ENCOUNTER — Emergency Department (HOSPITAL_COMMUNITY)
Admission: EM | Admit: 2018-09-29 | Discharge: 2018-09-29 | Disposition: A | Discharge status: Home / Self Care | Payer: Medicaid Other | Attending: Emergency Medicine | Admitting: Emergency Medicine

## 2018-09-29 DIAGNOSIS — Z87891 Personal history of nicotine dependence: Secondary | ICD-10-CM | POA: Insufficient documentation

## 2018-09-29 DIAGNOSIS — Z79899 Other long term (current) drug therapy: Secondary | ICD-10-CM | POA: Insufficient documentation

## 2018-09-29 DIAGNOSIS — J45909 Unspecified asthma, uncomplicated: Secondary | ICD-10-CM | POA: Insufficient documentation

## 2018-09-29 DIAGNOSIS — R1032 Left lower quadrant pain: Secondary | ICD-10-CM

## 2018-09-29 LAB — BASIC METABOLIC PANEL
Anion gap: 10 (ref 5–15)
BUN: 6 mg/dL (ref 6–20)
CO2: 25 mmol/L (ref 22–32)
Calcium: 9.3 mg/dL (ref 8.9–10.3)
Chloride: 105 mmol/L (ref 98–111)
Creatinine, Ser: 0.88 mg/dL (ref 0.44–1.00)
GFR calc Af Amer: >60 mL/min (ref >60)
GFR calc non Af Amer: >60 mL/min (ref >60)
Glucose, Bld: 102 mg/dL — ABNORMAL HIGH (ref 70–99)
Potassium: 3.8 mmol/L (ref 3.5–5.1)
Sodium: 140 mmol/L (ref 135–145)

## 2018-09-29 LAB — URINALYSIS, ROUTINE W REFLEX MICROSCOPIC
Bilirubin Urine: NEGATIVE
Glucose, UA: NEGATIVE mg/dL
Hgb urine dipstick: NEGATIVE
Ketones, ur: NEGATIVE mg/dL
Leukocytes,Ua: NEGATIVE
Nitrite: NEGATIVE
Protein, ur: NEGATIVE mg/dL
Specific Gravity, Urine: 1.028 (ref 1.005–1.030)
pH: 6 (ref 5.0–8.0)

## 2018-09-29 LAB — CBC WITH DIFFERENTIAL/PLATELET
Abs Immature Granulocytes: 0.01 10*3/uL (ref 0.00–0.07)
Basophils Absolute: 0 10*3/uL (ref 0.0–0.1)
Basophils Relative: 0 %
Eosinophils Absolute: 0.1 10*3/uL (ref 0.0–0.5)
Eosinophils Relative: 1 %
HCT: 34.9 % — ABNORMAL LOW (ref 36.0–46.0)
Hemoglobin: 10.8 g/dL — ABNORMAL LOW (ref 12.0–15.0)
Immature Granulocytes: 0 %
Lymphocytes Relative: 30 %
Lymphs Abs: 1.5 10*3/uL (ref 0.7–4.0)
MCH: 26.2 pg (ref 26.0–34.0)
MCHC: 30.9 g/dL (ref 30.0–36.0)
MCV: 84.7 fL (ref 80.0–100.0)
Monocytes Absolute: 0.5 10*3/uL (ref 0.1–1.0)
Monocytes Relative: 10 %
Neutro Abs: 3 10*3/uL (ref 1.7–7.7)
Neutrophils Relative %: 59 %
Platelets: 336 10*3/uL (ref 150–400)
RBC: 4.12 MIL/uL (ref 3.87–5.11)
RDW: 13.6 % (ref 11.5–15.5)
WBC: 5.2 10*3/uL (ref 4.0–10.5)
nRBC: 0 % (ref 0.0–0.2)

## 2018-09-29 LAB — WET PREP, GENITAL
Clue Cells Wet Prep HPF POC: NONE SEEN
Sperm: NONE SEEN
Trich, Wet Prep: NONE SEEN
Yeast Wet Prep HPF POC: NONE SEEN

## 2018-09-29 LAB — POC URINE PREG, ED: Preg Test, Ur: NEGATIVE

## 2018-09-29 MED ORDER — CEFTRIAXONE SODIUM 250 MG IJ SOLR
250.0000 mg | Freq: Once | INTRAMUSCULAR | Status: AC
  Administered 2018-09-29: 250 mg via INTRAMUSCULAR
  Filled 2018-09-29: qty 250

## 2018-09-29 MED ORDER — ACETAMINOPHEN 325 MG PO TABS
650.0000 mg | ORAL_TABLET | Freq: Once | ORAL | Status: AC
  Administered 2018-09-29: 650 mg via ORAL
  Filled 2018-09-29: qty 2

## 2018-09-29 MED ORDER — AZITHROMYCIN 250 MG PO TABS
1000.0000 mg | ORAL_TABLET | Freq: Once | ORAL | Status: AC
  Administered 2018-09-29: 1000 mg via ORAL
  Filled 2018-09-29: qty 4

## 2018-09-29 MED ORDER — STERILE WATER FOR INJECTION IJ SOLN
INTRAMUSCULAR | Status: AC
  Administered 2018-09-29: 10 mL
  Filled 2018-09-29: qty 10

## 2018-09-29 NOTE — ED Notes (Signed)
Called for room X3 with no answer 

## 2018-09-29 NOTE — ED Notes (Signed)
Patient verbalizes understanding of discharge instructions. Opportunity for questioning and answers were provided. Armband removed by staff, pt discharged from ED.  

## 2018-09-29 NOTE — Discharge Instructions (Addendum)
You have been seen today for abdominal pain. Please read and follow all provided instructions.   1. Medications: usual home medications 2. Treatment: rest, drink plenty of fluids, tylenol/ibuprofen for pain 3. Follow Up: Please follow up with OBGYN. Please follow up with your primary doctor in 7 days for discussion of your diagnoses and further evaluation after today's visit; if you do not have a primary care doctor use the resource guide provided to find one; Please return to the ER for any new or worsening symptoms. Please obtain all of your results from medical records or have your doctors office obtain the results - share them with your doctor - you should be seen at your doctors office. Call today to arrange your follow up.   Take medications as prescribed. Please review all of the medicines and only take them if you do not have an allergy to them. Return to the emergency room for worsening condition or new concerning symptoms. Follow up with your regular doctor. If you don't have a regular doctor use one of the numbers below to establish a primary care doctor.  Please be aware that if you are taking birth control pills, taking other prescriptions, ESPECIALLY ANTIBIOTICS may make the birth control ineffective - if this is the case, either do not engage in sexual activity or use alternative methods of birth control such as condoms until you have finished the medicine and your family doctor says it is OK to restart them. If you are on a blood thinner such as COUMADIN, be aware that any other medicine that you take may cause the coumadin to either work too much, or not enough - you should have your coumadin level rechecked in next 7 days if this is the case.  ?  It is also a possibility that you have an allergic reaction to any of the medicines that you have been prescribed - Everybody reacts differently to medications and while MOST people have no trouble with most medicines, you may have a reaction  such as nausea, vomiting, rash, swelling, shortness of breath. If this is the case, please stop taking the medicine immediately and contact your physician.  ?  You should return to the ER if you develop severe or worsening symptoms.   Emergency Department Resource Guide 1) Find a Doctor and Pay Out of Pocket Although you won't have to find out who is covered by your insurance plan, it is a good idea to ask around and get recommendations. You will then need to call the office and see if the doctor you have chosen will accept you as a new patient and what types of options they offer for patients who are self-pay. Some doctors offer discounts or will set up payment plans for their patients who do not have insurance, but you will need to ask so you aren't surprised when you get to your appointment.  2) Contact Your Local Health Department Not all health departments have doctors that can see patients for sick visits, but many do, so it is worth a call to see if yours does. If you don't know where your local health department is, you can check in your phone book. The CDC also has a tool to help you locate your state's health department, and many state websites also have listings of all of their local health departments.  3) Find a [facility] If your illness is not likely to be very severe or complicated, you may want to try a walk  in clinic. These are popping up all over the country in pharmacies, drugstores, and shopping centers. They're usually staffed by nurse practitioners or physician assistants that have been trained to treat common illnesses and complaints. They're usually fairly quick and inexpensive. However, if you have serious medical issues or chronic medical problems, these are probably not your best option.  No Primary Care Doctor: Call Health Connect at  [phone] - they can help you locate a primary care doctor that  accepts your insurance, provides certain services, etc. Physician  Referral [phone]  Emergency Department Resource Guide 1) Find a Doctor and Pay Out of Pocket Although you won't have to find out who is covered by your insurance plan, it is a good idea to ask around and get recommendations. You will then need to call the office and see if the doctor you have chosen will accept you as a new patient and what types of options they offer for patients who are self-pay. Some doctors offer discounts or will set up payment plans for their patients who do not have insurance, but you will need to ask so you aren't surprised when you get to your appointment.  2) Contact Your Local Health Department Not all health departments have doctors that can see patients for sick visits, but many do, so it is worth a call to see if yours does. If you don't know where your local health department is, you can check in your phone book. The CDC also has a tool to help you locate your state's health department, and many state websites also have listings of all of their local health departments.  3) Find a [facility] If your illness is not likely to be very severe or complicated, you may want to try a walk in clinic. These are popping up all over the country in pharmacies, drugstores, and shopping centers. They're usually staffed by nurse practitioners or physician assistants that have been trained to treat common illnesses and complaints. They're usually fairly quick and inexpensive. However, if you have serious medical issues or chronic medical problems, these are probably not your best option.  No Primary Care Doctor: Call Health Connect at  [phone] - they can help you locate a primary care doctor that  accepts your insurance, provides certain services, etc. Physician Referral Service- [phone]  Chronic Pain Problems: Organization         Address  Phone   Notes  [facility]  [phone] Patients need to be referred by their primary care  doctor.   Medication Assistance: Organization         Address  Phone   Notes  [facility] Medication [facility] [address] [place], [place] [address] [phone] --Must be a resident of [facility] -- Must have NO insurance coverage whatsoever (no Medicaid/ Medicare, etc.) -- The pt. MUST have a primary care doctor that directs their care regularly and follows them in the community   [facility]  [phone]   [employer]  [phone]    Agencies that provide inexpensive medical care: Organization         Address  Phone   Notes  [place]  [phone]   [facility]    [phone]   [facility] [place], [place] [address] [phone]   [place] [address] [place] [phone]   Planned Parenthood    (  [phone]   [facility]    [phone]   [facility] and [facility]  [address], [place] Phone:  [phone], Fax:  [phone] Hours of Operation:  9 am - 6 pm, M-F.  Also accepts Medicaid/Medicare and self-pay.  [facility] for [place], [address], [place] Phone: [phone], Fax: [phone]. Hours of Operation:  8:30 am - 5:30 pm, M-F.  Also accepts Medicaid and self-pay.  [facility] [place] [address] [place] Phone: [phone]   [place], [place], [place] [phone], Ext. [phone] Mondays & Thursdays: 7-9 AM.  First 15 patients are seen on a first come, first serve basis.    [place] Providers:  Organization         Address  Phone   Notes  [facility] [address], [place] [phone] Also accepts self-pay patients.  [facility] [address], [place]  [phone]   [place], [address], [place] [phone]   [facility] [address] [place] [phone]   Lucianne Lei [address], [place]   [phone] Only accepts [place] [facility] [place] patients after they have their name applied to their card.   Self-Pay (no insurance) in [facility]:  Organization         Address  Phone   Notes  Sickle Cell Patients, [facility] [place] [phone]   [facility] [place] [phone]   [facility] [place]  [place], [place], [place] [phone]   [facility]/Dr. Osei-Bonsu  [address] [place] or [address], [place] [phone] Phone number for both [place] and [place] locations is the same.  [facility] and [facility] [address], [place] [phone]   [facility] [address], [place] or [address] [phone]   [facility] [address], [place] [phone], phone; [phone], fax Sees patients 1st and 3rd Saturday of every month.  Must not qualify for public or private insurance (i.e. Medicaid, Medicare, [place] [employer] Choice, Veterans' Benefits)  Household income should be no more than 200% of the poverty level The clinic cannot treat you if you are pregnant or think you are pregnant  Sexually transmitted diseases are not treated at the clinic.

## 2018-09-29 NOTE — ED Triage Notes (Addendum)
Pt reports LLQ pain since yesterday morning. She reports she came in yesterday but never saw a provider due to wait time. States pain has improved some since yesterday, denies n/v/d.

## 2018-09-29 NOTE — ED Provider Notes (Signed)
[facility] EMERGENCY DEPARTMENT Provider Note   CSN: [id number] Arrival date & time: 09/29/18  1031     History   Chief Complaint Chief Complaint  Patient presents with  . Abdominal Pain    HPI Dawn Zamora is a 28 y.o. female with a PMH of Asthma and Chlamydia constant left lower quadrant abdominal pain radiating to her left back onset yesterday morning. Patient describes pain as a dull ache. Patient reports she came to the ER yesterday, but left due to the wait time. Patient reports pain has improved since yesterday. Patient denies taking any medications. Patient denies fever, cough, congestion, nausea, vomiting, diarrhea, or constipation. Patient reports last BM was yesterday and denies any problems. Patient states she was recently tested for STDs and it was negative. Patient states she is not sexually active and does not want to be tested for STDs today. Patient reports urinary frequency, but denies dysuria. Patient reports white vaginal discharge, but denies lesions or itching. Patient reports irregular menstrual cycles and states she is scheduled to see OBGYN later this month. Patient denies a history of kidney stones. Patient denies any sick exposures. LMP was on 02/01.   HPI  Past Medical History:  Diagnosis Date  . Asthma   . Dysrhythmia   . Hx of chlamydia infection     Patient Active Problem List   Diagnosis Date Noted  . Adjustment disorder with depressed mood 02/15/2017  . NSVD (normal spontaneous vaginal delivery) 01/07/2014  . Normal labor 01/06/2014    Past Surgical History:  Procedure Laterality Date  . NO PAST SURGERIES       OB History    Gravida  2   Para  1   Term  1   Preterm      AB  1   Living  1     SAB      TAB  1   Ectopic      Multiple      Live Births  1            Home Medications    Prior to Admission medications   Medication Sig Start Date End Date Taking? Authorizing Provider  albuterol  (PROVENTIL HFA;VENTOLIN HFA) 108 (90 Base) MCG/ACT inhaler Inhale 1-2 puffs into the lungs every 6 (six) hours as needed for wheezing or shortness of breath.    [provider]  hydrOXYzine (ATARAX/VISTARIL) 25 MG tablet Take 1 tablet (25 mg total) by mouth 3 (three) times daily as needed for anxiety. Patient not taking: Reported on 11/17/2017 02/15/17   Patrecia Pour, NP  norethindrone (AYGESTIN) 5 MG tablet Take 1 tablet (5 mg total) by mouth daily. 08/25/18   Ward, Ozella Almond, PA-C  traZODone (DESYREL) 50 MG tablet Take 1 tablet (50 mg total) by mouth at bedtime as needed for sleep. Patient not taking: Reported on 11/17/2017 02/15/17   Patrecia Pour, NP    Family History Family History  Problem Relation Age of Onset  . Polydactyly Paternal Uncle   . Sickle cell anemia Cousin   . Diabetes Mother   . Diabetes Maternal Aunt   . Diabetes Maternal Grandmother   . Diabetes Maternal Grandfather     Social History Social History   Tobacco Use  . Smoking status: Former Smoker    Last attempt to quit: 10/20/2010    Years since quitting: 7.9  . Smokeless tobacco: Never Used  Substance Use Topics  . Alcohol use: Yes  Comment: Occas. prior to pregnancy  . Drug use: No     Allergies   Aspirin; Coconut oil; Lemon balm [melissa officinalis]; Copper-containing compounds; and Nickel   Review of Systems Review of Systems  Constitutional: Negative for activity change, appetite change, chills, fever and unexpected weight change.  HENT: Negative for congestion, rhinorrhea and sore throat.   Eyes: Negative for visual disturbance.  Respiratory: Negative for cough and shortness of breath.   Cardiovascular: Negative for chest pain.  Gastrointestinal: Positive for abdominal pain. Negative for blood in stool, constipation, diarrhea, nausea and vomiting.  Endocrine: Negative for polydipsia, polyphagia and polyuria.  Genitourinary: Positive for frequency and vaginal discharge. Negative for  difficulty urinating, dysuria, flank pain, genital sores and vaginal bleeding.  Musculoskeletal: Positive for back pain. Negative for myalgias.  Skin: Negative for rash.  Allergic/Immunologic: Negative for immunocompromised state.  Neurological: Negative for weakness.  Psychiatric/Behavioral: The patient is not nervous/anxious.     Physical Exam Updated Vital Signs BP 110/75   Pulse 67   Temp 98.5 F (36.9 C) (Oral)   Resp 15   LMP 09/18/2018   SpO2 100%   Physical Exam Vitals signs and nursing note reviewed. Exam conducted with a chaperone present.  Constitutional:      General: She is not in acute distress.    Appearance: She is well-developed. She is not diaphoretic.  HENT:     Head: Normocephalic and atraumatic.     Mouth/Throat:     Mouth: Mucous membranes are moist.     Pharynx: No oropharyngeal exudate.  Eyes:     General: No scleral icterus.    Extraocular Movements: Extraocular movements intact.     Pupils: Pupils are equal, round, and reactive to light.  Neck:     Musculoskeletal: Normal range of motion and neck supple.  Cardiovascular:     Rate and Rhythm: Normal rate and regular rhythm.     Heart sounds: Normal heart sounds. No murmur. No friction rub. No gallop.   Pulmonary:     Effort: Pulmonary effort is normal. No respiratory distress.     Breath sounds: Normal breath sounds. No wheezing or rales.  Abdominal:     General: Abdomen is flat. Bowel sounds are normal. There is no distension.     Palpations: Abdomen is soft. Abdomen is not rigid. There is no mass.     Tenderness: There is abdominal tenderness in the suprapubic area and left lower quadrant. There is no right CVA tenderness, left CVA tenderness, guarding or rebound. Negative signs include McBurney's sign.     Hernia: No hernia is present.  Genitourinary:    Vagina: Vaginal discharge present. No erythema or tenderness.     Cervix: Discharge [facility] discharge noted on exam. ) present. No cervical  motion tenderness.     Uterus: Normal.      Adnexa: Right adnexa normal and left adnexa normal.  Musculoskeletal: Normal range of motion.  Skin:    General: Skin is warm.     Findings: No rash.  Neurological:     Mental Status: She is alert and oriented to person, place, and time.    ED Treatments / Results  Labs (all labs ordered are listed, but only abnormal results are displayed) Labs Reviewed  WET PREP, GENITAL - Abnormal; Notable for the following components:      Result Value   WBC, Wet Prep HPF POC MANY (*)    All other components within normal limits  URINALYSIS, ROUTINE W REFLEX  MICROSCOPIC - Abnormal; Notable for the following components:   APPearance HAZY (*)    All other components within normal limits  BASIC METABOLIC PANEL - Abnormal; Notable for the following components:   Glucose, Bld 102 (*)    All other components within normal limits  CBC WITH DIFFERENTIAL/PLATELET - Abnormal; Notable for the following components:   Hemoglobin 10.8 (*)    HCT 34.9 (*)    All other components within normal limits  POC URINE PREG, ED    EKG None  Radiology No results found.  Procedures Procedures (including critical care time)  Medications Ordered in ED Medications  cefTRIAXone (ROCEPHIN) injection 250 mg (has no administration in time range)  azithromycin (ZITHROMAX) tablet 1,000 mg (has no administration in time range)  acetaminophen (TYLENOL) tablet 650 mg (650 mg Oral Given 09/29/18 1222)     Initial Impression / Assessment and Plan / ED Course  I have reviewed the triage vital signs and the nursing notes.  Pertinent labs & imaging results that were available during my care of the patient were reviewed by me and considered in my medical decision making (see chart for details).  Clinical Course as of Sep 29 1354  Wed Sep 29, 2018  1352 WBCs noted on wet prep. Discussed prophylactic STD treatment with patient and patient agrees.   WBC, Wet Prep HPF POC(!):  MANY [AH]    Clinical Course User Index [AH] Arville Lime, PA-C   Patient is nontoxic, nonseptic appearing, in no apparent distress.  Patient's pain and other symptoms adequately managed in emergency department.  Labs, imaging and vitals reviewed.  Patient does not meet the SIRS or Sepsis criteria.  On repeat exam patient does not have a surgical abdomin and there are no peritoneal signs.  No indication of appendicitis, bowel obstruction, bowel perforation, cholecystitis, diverticulitis, ovarian torsion, or ectopic pregnancy.  Pain has improved while in the ER. WBCs noted on wet prep. Patient requested to not be tested for STIs due to recent normal lab work. Discussed prophylactic STD treatment with patient and patient has agreed. Provided antibiotics while in the ER. Patient discharged home with symptomatic treatment and given strict instructions for follow-up with their OBGYN and primary care physician.  I have also discussed reasons to return immediately to the ER.  Patient expresses understanding and agrees with plan.  Final Clinical Impressions(s) / ED Diagnoses   Final diagnoses:  Left lower quadrant abdominal pain    ED Discharge Orders    None       Arville Lime, [place] 09/29/18 1411    Dorie Rank, MD 10/01/18 1620

## 2018-11-10 ENCOUNTER — Telehealth (HOSPITAL_COMMUNITY): Payer: Self-pay | Admitting: *Deleted

## 2018-11-10 NOTE — Telephone Encounter (Signed)
Telephoned patient at home number and confirmed appointment for Thursday March 26. Screened patient for COVID-19 no symptoms and no travel.

## 2018-11-11 ENCOUNTER — Encounter (HOSPITAL_COMMUNITY): Payer: Self-pay

## 2018-11-11 ENCOUNTER — Other Ambulatory Visit: Payer: Self-pay

## 2018-11-11 ENCOUNTER — Ambulatory Visit (HOSPITAL_COMMUNITY)
Admission: RE | Admit: 2018-11-11 | Discharge: 2018-11-11 | Disposition: A | Discharge status: Home / Self Care | Payer: Medicaid Other | Source: Ambulatory Visit | Attending: Obstetrics and Gynecology | Admitting: Obstetrics and Gynecology

## 2018-11-11 ENCOUNTER — Ambulatory Visit (HOSPITAL_COMMUNITY): Payer: Self-pay

## 2018-11-11 VITALS — BP 102/68 | Temp 98.4°F | Wt 127.0 lb

## 2018-11-11 DIAGNOSIS — R87810 Cervical high risk human papillomavirus (HPV) DNA test positive: Secondary | ICD-10-CM

## 2018-11-11 DIAGNOSIS — R87610 Atypical squamous cells of undetermined significance on cytologic smear of cervix (ASC-US): Secondary | ICD-10-CM

## 2018-11-11 DIAGNOSIS — Z1239 Encounter for other screening for malignant neoplasm of breast: Secondary | ICD-10-CM

## 2018-11-11 HISTORY — DX: Anemia, unspecified: D64.9

## 2018-11-11 NOTE — Patient Instructions (Signed)
Explained breast self awareness with Dawn Zamora. Patient did not need a Pap smear today due to last Pap smear was 08/04/2018. Explained the colposcopy the recommended follow-up for her abnormal Pap smear. Referred patient to the [facility] [place] [facility] for a colposcopy to follow-up for her abnormal Pap smear. Appointment scheduled for Thursday, December 16, 2018 at [phone]. Patient aware of appointment and will be there. Let patient know a screening mammogram is recommended at age 28 unless clinically indicated prior. Dawn Zamora verbalized understanding.  [name], [place], RN 10:10 AM

## 2018-11-11 NOTE — Progress Notes (Signed)
Patient referred to [facility] by the [facility] Department due to having an abnormal Pap smear on 08/04/2018 that a colposcopy is recommended for follow-up.  Pap Smear: Pap smear not completed today. Last Pap smear was 08/04/2018 at the [facility] Department and ASCUS with positive HPV. Referred patient to the [facility] for [place] [facility] for a colposcopy to follow-up for her abnormal Pap smear. Appointment scheduled for Thursday, December 16, 2018 at [phone]. Per patient has a history of an abnormal Pap smear 1-2 years ago that a repeat Pap smear was completed for follow-up that was normal. Last Pap smear result is in Epic.  Physical exam: Breasts Breasts symmetrical. No skin abnormalities bilateral breasts. No nipple retraction bilateral breasts. No nipple discharge bilateral breasts. No lymphadenopathy. No lumps palpated bilateral breasts. No complaints of pain or tenderness on exam. Screening mammogram recommended at age 83 unless clinically indicated prior.      Pelvic/Bimanual No Pap smear completed today since last Pap smear and HPV typing was 08/04/2018. Pap smear not indicated per B[facility]CP guidelines.   Smoking History: Patient is a former smoker that quit in 2012.  Patient Navigation: Patient education provided. Access to services provided for patient through B[facility]CP program.   Breast and Cervical Cancer Risk Assessment: Patient has no family history of breast cancer, known genetic mutations, or radiation treatment to the chest before age 51. Patient has no history of cervical dysplasia, immunocompromised, or DES exposure in-utero. Breast cancer risk assessment completed. No breast cancer risk calculated due to patient is less than 10 years old.

## 2018-12-16 ENCOUNTER — Encounter: Payer: Self-pay | Admitting: Obstetrics & Gynecology

## 2018-12-27 ENCOUNTER — Encounter (HOSPITAL_COMMUNITY): Payer: Self-pay | Admitting: *Deleted

## 2019-03-30 ENCOUNTER — Ambulatory Visit: Payer: Medicaid Other | Admitting: Obstetrics and Gynecology

## 2019-04-07 ENCOUNTER — Encounter: Payer: Self-pay | Admitting: General Practice

## 2019-04-07 ENCOUNTER — Ambulatory Visit (INDEPENDENT_AMBULATORY_CARE_PROVIDER_SITE_OTHER): Payer: Self-pay | Admitting: Obstetrics & Gynecology

## 2019-04-07 ENCOUNTER — Other Ambulatory Visit (HOSPITAL_COMMUNITY)
Admission: RE | Admit: 2019-04-07 | Discharge: 2019-04-07 | Disposition: A | Discharge status: Home / Self Care | Payer: Medicaid Other | Source: Ambulatory Visit | Attending: Obstetrics & Gynecology | Admitting: Obstetrics & Gynecology

## 2019-04-07 ENCOUNTER — Other Ambulatory Visit: Payer: Self-pay

## 2019-04-07 VITALS — BP 108/62 | HR 71 | Temp 98.9°F | Wt 129.9 lb

## 2019-04-07 DIAGNOSIS — B977 Papillomavirus as the cause of diseases classified elsewhere: Secondary | ICD-10-CM

## 2019-04-07 DIAGNOSIS — N870 Mild cervical dysplasia: Secondary | ICD-10-CM

## 2019-04-07 LAB — POCT PREGNANCY, URINE: Preg Test, Ur: NEGATIVE

## 2019-04-07 NOTE — Addendum Note (Signed)
Addended by: Fidela Juneau A on: 04/07/2019 10:40 AM   Modules accepted: Orders

## 2019-04-07 NOTE — Progress Notes (Signed)
   Subjective:    Patient ID: Norwood Levo, female    DOB: [birth year], 28 y.o.   MRN: [id number]  HPI 28 yo single lady here for a colpo due to a pap at the health dept that showed + HR HPV 6/7.   Review of Systems She is currently abstinent. She doesn't think that she has had Gardasil.    Objective:   Physical Exam Breathing, conversing, and ambulating normally Well nourished, well hydrated Black female, no apparent distress UPT negative, consent signed, time out done Cervix prepped with acetic acid. Transformation zone seen in its entirety. Colpo adequate. 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  Silver nitrate achieved hemostasis. ECC obtained. She tolerated the procedure well.     Assessment & Plan:  + HR HPV- await pathology I rec'd that she get Mychart Start Gardasil today Next vaccine in 2 months

## 2019-06-28 ENCOUNTER — Telehealth: Payer: Self-pay

## 2019-06-28 NOTE — Telephone Encounter (Addendum)
-----   Message from Emily Filbert, MD sent at 06/24/2019  9:45 AM EST ----- Regarding: RE: Results She needs a repeat pap next September. ----- Message ----- From: Michel Harrow, RN Sent: 04/26/2019  11:09 AM EST To: Emily Filbert, MD Subject: Results                                        Please review pt's results for colpo.  Thank you Mel Almond, RN   LM for pt that we request that she calls next year in September for a pap smear.  If she has questions to please give [place] a call with questions.

## 2019-06-30 ENCOUNTER — Encounter (HOSPITAL_COMMUNITY): Payer: Self-pay | Admitting: Emergency Medicine

## 2019-06-30 ENCOUNTER — Other Ambulatory Visit: Payer: Self-pay

## 2019-06-30 ENCOUNTER — Ambulatory Visit (HOSPITAL_COMMUNITY)
Admission: EM | Admit: 2019-06-30 | Discharge: 2019-06-30 | Disposition: A | Discharge status: Home / Self Care | Payer: BC Managed Care – PPO | Attending: Urgent Care | Admitting: Urgent Care

## 2019-06-30 ENCOUNTER — Ambulatory Visit (INDEPENDENT_AMBULATORY_CARE_PROVIDER_SITE_OTHER): Payer: BC Managed Care – PPO

## 2019-06-30 DIAGNOSIS — W19XXXA Unspecified fall, initial encounter: Secondary | ICD-10-CM

## 2019-06-30 DIAGNOSIS — M25562 Pain in left knee: Secondary | ICD-10-CM

## 2019-06-30 DIAGNOSIS — S8002XA Contusion of left knee, initial encounter: Secondary | ICD-10-CM

## 2019-06-30 MED ORDER — ACETAMINOPHEN 325 MG PO TABS
ORAL_TABLET | ORAL | Status: AC
  Filled 2019-06-30: qty 2

## 2019-06-30 MED ORDER — ACETAMINOPHEN 325 MG PO TABS
650.0000 mg | ORAL_TABLET | Freq: Once | ORAL | Status: AC
  Administered 2019-06-30: 650 mg via ORAL

## 2019-06-30 MED ORDER — NAPROXEN 500 MG PO TABS: 500.0000 mg | ORAL_TABLET | Freq: Two times a day (BID) | ORAL | 0 refills | Status: DC

## 2019-06-30 NOTE — ED Triage Notes (Signed)
Pt states she slipped and fell today in a puddle at work, injured her L knee. Pt ambulatory with steady gait.

## 2019-06-30 NOTE — ED Provider Notes (Signed)
  [place]   MRN: [id number] DOB: Jan 01, 1991  Subjective:   Dawn Zamora is a 28 y.o. female presenting for suffering an acute left knee injury while at work today. Patient states that she slipped and fell from stepping into a puddle of water. Fell directly onto her knee on a hard surface. Has had medial left knee pain and slight swelling.  Has also had intermittent burning sensation along left lateral side of her knee. Has not tried any medications for relief.   Ginnette has a medication list, allergies, pmh and psh that were reviewed, updated as appropriate and not included due to being a worker's injury case.  ROS   Objective:   Vitals: BP 112/75   Pulse 61   Temp (!) 97.4 F (36.3 C)   Resp 18   SpO2 100%   Physical Exam Constitutional:      General: She is not in acute distress.    Appearance: Normal appearance. She is well-developed. She is not ill-appearing.  HENT:     Head: Normocephalic and atraumatic.     Nose: Nose normal.     Mouth/Throat:     Mouth: Mucous membranes are moist.     Pharynx: Oropharynx is clear.  Eyes:     General: No scleral icterus.    Extraocular Movements: Extraocular movements intact.     Pupils: Pupils are equal, round, and reactive to light.  Cardiovascular:     Rate and Rhythm: Normal rate.  Pulmonary:     Effort: Pulmonary effort is normal.  Musculoskeletal:     Left knee: She exhibits decreased range of motion (slight decrease in flexion and extension) and swelling (trace medially). She exhibits no effusion, no ecchymosis, no deformity, no laceration, no erythema, normal alignment, normal patellar mobility and no bony tenderness. Tenderness found. Medial joint line tenderness noted.  Skin:    General: Skin is warm and dry.  Neurological:     General: No focal deficit present.     Mental Status: She is alert and oriented to person, place, and time.  Psychiatric:        Mood and Affect: Mood normal.        Behavior:  Behavior normal.     Dg Knee Complete 4 Views Left  Result Date: 06/30/2019 CLINICAL DATA:  Left knee pain after fall. EXAM: LEFT KNEE - COMPLETE 4+ VIEW COMPARISON:  None. FINDINGS: No evidence of fracture, dislocation, or joint effusion. No evidence of arthropathy or other focal bone abnormality. Soft tissues are unremarkable. IMPRESSION: Negative. Electronically Signed   By: Titus Dubin M.D.   On: 06/30/2019 18:39     Assessment and Plan :   1. Acute pain of left knee   2. Fall, initial encounter   3. Contusion of left knee, initial encounter     We will manage conservatively for knee contusion with Naprosyn rice method.  Provided patient with Ace wrap in clinic, wrapped her knee as well.  Patient given work restrictions.  Follow-up with occupational health. Counseled patient on potential for adverse effects with medications prescribed/recommended today, ER and return-to-clinic precautions discussed, patient verbalized understanding.    Jaynee Eagles, [place] 06/30/19 [id number]

## 2019-07-25 ENCOUNTER — Other Ambulatory Visit: Payer: Self-pay

## 2019-07-25 ENCOUNTER — Emergency Department (HOSPITAL_COMMUNITY): Payer: BC Managed Care – PPO

## 2019-07-25 ENCOUNTER — Emergency Department (HOSPITAL_COMMUNITY): Payer: BC Managed Care – PPO | Attending: Student

## 2019-07-25 ENCOUNTER — Encounter (HOSPITAL_COMMUNITY): Payer: Self-pay | Admitting: Emergency Medicine

## 2019-07-25 ENCOUNTER — Emergency Department (HOSPITAL_COMMUNITY)
Admission: EM | Admit: 2019-07-25 | Discharge: 2019-07-25 | Disposition: A | Discharge status: Home / Self Care | Payer: No Typology Code available for payment source | Attending: Emergency Medicine | Admitting: Emergency Medicine

## 2019-07-25 DIAGNOSIS — J45909 Unspecified asthma, uncomplicated: Secondary | ICD-10-CM | POA: Diagnosis not present

## 2019-07-25 DIAGNOSIS — T148XXA Other injury of unspecified body region, initial encounter: Secondary | ICD-10-CM

## 2019-07-25 DIAGNOSIS — W010XXA Fall on same level from slipping, tripping and stumbling without subsequent striking against object, initial encounter: Secondary | ICD-10-CM | POA: Diagnosis not present

## 2019-07-25 DIAGNOSIS — Z87891 Personal history of nicotine dependence: Secondary | ICD-10-CM | POA: Insufficient documentation

## 2019-07-25 DIAGNOSIS — Y92219 Unspecified school as the place of occurrence of the external cause: Secondary | ICD-10-CM | POA: Diagnosis not present

## 2019-07-25 DIAGNOSIS — Z79899 Other long term (current) drug therapy: Secondary | ICD-10-CM | POA: Insufficient documentation

## 2019-07-25 DIAGNOSIS — M546 Pain in thoracic spine: Secondary | ICD-10-CM | POA: Diagnosis not present

## 2019-07-25 DIAGNOSIS — Y990 Civilian activity done for income or pay: Secondary | ICD-10-CM | POA: Insufficient documentation

## 2019-07-25 DIAGNOSIS — Y9302 Activity, running: Secondary | ICD-10-CM | POA: Diagnosis not present

## 2019-07-25 DIAGNOSIS — M542 Cervicalgia: Secondary | ICD-10-CM | POA: Diagnosis not present

## 2019-07-25 DIAGNOSIS — W19XXXA Unspecified fall, initial encounter: Secondary | ICD-10-CM

## 2019-07-25 DIAGNOSIS — S0990XA Unspecified injury of head, initial encounter: Secondary | ICD-10-CM | POA: Diagnosis not present

## 2019-07-25 MED ORDER — HYDROCODONE-ACETAMINOPHEN 5-325 MG PO TABS
1.0000 | ORAL_TABLET | Freq: Once | ORAL | Status: AC
  Administered 2019-07-25: 12:00:00 1 via ORAL
  Filled 2019-07-25: qty 1

## 2019-07-25 MED ORDER — IBUPROFEN 600 MG PO TABS: 600.0000 mg | ORAL_TABLET | Freq: Four times a day (QID) | ORAL | 0 refills | Status: DC | PRN

## 2019-07-25 MED ORDER — BACITRACIN ZINC 500 UNIT/GM EX OINT
TOPICAL_OINTMENT | Freq: Once | CUTANEOUS | Status: AC
  Administered 2019-07-25: 1 via TOPICAL
  Filled 2019-07-25: qty 0.9

## 2019-07-25 MED ORDER — METHOCARBAMOL 500 MG PO TABS: 500.0000 mg | ORAL_TABLET | Freq: Two times a day (BID) | ORAL | 0 refills | Status: DC

## 2019-07-25 NOTE — Discharge Instructions (Signed)
The pain you are experiencing is likely due to muscle strain, your imaging was reassuring.  You may take Ibuprofen and Robaxin as needed for pain management. Do not combine with any pain reliever other than tylenol.  You may also use ice and heat, and over-the-counter remedies such as Biofreeze gel or salon pas lidocaine patches. The muscle soreness should improve over the next week. Follow up with your family doctor in the next week for a recheck if you are still having symptoms. Return to ED if pain is worsening, you develop weakness or numbness of extremities, or new or concerning symptoms develop.

## 2019-07-25 NOTE — ED Triage Notes (Addendum)
Pt was chasing a child at work when he tripped and she tripped over him, falling onto her hands. C/o Mid thoracic spine pain, pain to head. No LOC. Pt a/ox4, resp e/u, nad. EMS VSS:  118/77, 63HR. ccollar in place by ems

## 2019-07-25 NOTE — ED Provider Notes (Signed)
[facility] EMERGENCY DEPARTMENT Provider Note   CSN: [id number] Arrival date & time: 07/25/19  1044     History   Chief Complaint Chief Complaint  Patient presents with   Fall    HPI Dawn Zamora is a 28 y.o. female.     Dawn Zamora is a 28 y.o. female with a history of anemia, asthma, and dysrhythmia, who presents to the emergency department via EMS for evaluation after a fall at work.  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  She reports that the child tripped and she was right behind him and so fell as well and tried to swerve so that she would not fall on top of him.  She reports that she hit the concrete very hard striking the back of her head as well as her neck and upper back.  She reports that she does not think she lost consciousness but she is not sure.  She has had a gradually worsening headache since the fall, she denies nausea or vomiting, no vision changes.  She also reports a sharp pain in her mid upper back that is worse with twisting motions.  She also reports some pain over her left upper ribs.  Reports she caught herself on her left wrist and has a small scrape to the hand but denies any pain or swelling to the wrist, no difficulty moving the wrist or hand.  She denies any numbness weakness or tingling in her upper and lower extremities.  No abdominal pain.  She has not taken anything for pain prior to arrival.  No other aggravating or alleviating factors.  Tetanus is up-to-date and bandage applied to the small scrape on her hand.     Past Medical History:  Diagnosis Date   Anemia    Asthma    Dysrhythmia    Hx of chlamydia infection     Patient Active Problem List   Diagnosis Date Noted   Adjustment disorder with depressed mood 02/15/2017   NSVD (normal spontaneous vaginal delivery) 01/07/2014   Normal labor 01/06/2014    Past Surgical History:  Procedure Laterality  Date   NO PAST SURGERIES       OB History    Gravida  2   Para  1   Term  1   Preterm      AB  1   Living  1     SAB      TAB  1   Ectopic      Multiple      Live Births  1            Home Medications    Prior to Admission medications   Medication Sig Start Date End Date Taking? Authorizing Provider  albuterol (PROVENTIL HFA;VENTOLIN HFA) 108 (90 Base) MCG/ACT inhaler Inhale 1-2 puffs into the lungs every 6 (six) hours as needed for wheezing or shortness of breath.    [provider]  hydrOXYzine (ATARAX/VISTARIL) 25 MG tablet Take 1 tablet (25 mg total) by mouth 3 (three) times daily as needed for anxiety. Patient not taking: Reported on 11/17/2017 02/15/17   Patrecia Pour, NP  ibuprofen (ADVIL) 600 MG tablet Take 1 tablet (600 mg total) by mouth every 6 (six) hours as needed. 07/25/19   Jacqlyn Larsen, PA-C  methocarbamol (ROBAXIN) 500 MG tablet Take 1 tablet (500 mg total) by mouth 2 (two) times daily.  07/25/19   Jacqlyn Larsen, PA-C  naproxen (NAPROSYN) 500 MG tablet Take 1 tablet (500 mg total) by mouth 2 (two) times daily. 06/30/19   Jaynee Eagles, PA-C  norethindrone (AYGESTIN) 5 MG tablet Take 1 tablet (5 mg total) by mouth daily. Patient not taking: Reported on 11/11/2018 08/25/18   Ward, Ozella Almond, PA-C  traZODone (DESYREL) 50 MG tablet Take 1 tablet (50 mg total) by mouth at bedtime as needed for sleep. Patient not taking: Reported on 11/17/2017 02/15/17   Patrecia Pour, NP    Family History Family History  Problem Relation Age of Onset   Polydactyly Paternal Uncle    Sickle cell anemia Cousin    Diabetes Mother    Diabetes Maternal Aunt    Diabetes Maternal Grandmother    Diabetes Maternal Grandfather     Social History Social History   Tobacco Use   Smoking status: Former Smoker    Quit date: 10/20/2010    Years since quitting: 8.7   Smokeless tobacco: Never Used  Substance Use Topics   Alcohol use: Not Currently   Drug  use: No     Allergies   Aspirin, Coconut oil, Lemon oil, Lemon balm [melissa officinalis], Copper-containing compounds, and Nickel   Review of Systems Review of Systems  Constitutional: Negative for chills and fever.  HENT: Negative.   Eyes: Negative for visual disturbance.  Respiratory: Negative for cough and shortness of breath.   Cardiovascular: Positive for chest pain.  Gastrointestinal: Negative for nausea and vomiting.  Musculoskeletal: Positive for back pain and neck pain. Negative for arthralgias and joint swelling.  Skin: Positive for wound. Negative for color change.  Neurological: Positive for headaches. Negative for dizziness, syncope, weakness, light-headedness and numbness.  All other systems reviewed and are negative.    Physical Exam Updated Vital Signs BP 109/85 (BP Location: Right Arm)    Pulse 77    Temp 98.8 F (37.1 C) (Oral)    Resp 16    SpO2 100%   Physical Exam Vitals signs and nursing note reviewed.  Constitutional:      General: She is not in acute distress.    Appearance: Normal appearance. She is well-developed and normal weight. She is not diaphoretic.  HENT:     Head: Normocephalic.     Comments: Tenderness over the crown and occiput of the scalp without palpable deformity or step-off.  Negative battle sign, no CSF otorrhea    Mouth/Throat:     Mouth: Mucous membranes are moist.     Pharynx: Oropharynx is clear.  Eyes:     Extraocular Movements: Extraocular movements intact.     Conjunctiva/sclera: Conjunctivae normal.  Neck:     Musculoskeletal: Neck supple. Muscular tenderness present.     Trachea: No tracheal deviation.     Comments: Some tenderness of the lower thoracic spine at midline, c-collar in place. Cardiovascular:     Rate and Rhythm: Normal rate and regular rhythm.     Heart sounds: Normal heart sounds. No murmur. No friction rub. No gallop.   Pulmonary:     Effort: Pulmonary effort is normal.     Breath sounds: Normal  breath sounds. No stridor.     Comments: Respirations equal and unlabored, patient able to speak in full sentences, lungs clear to auscultation bilaterally, there is some tenderness over the left upper chest wall without overlying skin changes or palpable deformity Chest:     Chest wall: Tenderness present.  Abdominal:  General: Bowel sounds are normal. There is no distension.     Palpations: Abdomen is soft. There is no mass.     Tenderness: There is no abdominal tenderness. There is no guarding.     Comments: No ecchymosis, NTTP in all quadrants  Musculoskeletal:     Comments: Tenderness at midline of the upper thoracic spine without palpable deformity or step-off, no midline lumbar tenderness. There is a small scrape to the hyperthenar eminence of the left palm without palpable swelling or surrounding deformity, full range of motion of the left wrist.  All joints supple, and easily moveable with no obvious deformity, all compartments soft  Skin:    General: Skin is warm and dry.     Capillary Refill: Capillary refill takes less than 2 seconds.     Comments: No ecchymosis, lacerations or abrasions  Neurological:     Mental Status: She is alert and oriented to person, place, and time.     Comments: Speech is clear, able to follow commands CN III-XII intact Normal strength in upper and lower extremities bilaterally including dorsiflexion and plantar flexion, strong and equal grip strength Sensation normal to light and sharp touch Moves extremities without ataxia, coordination intact  Psychiatric:        Mood and Affect: Mood normal.        Behavior: Behavior normal.      ED Treatments / Results  Labs (all labs ordered are listed, but only abnormal results are displayed) Labs Reviewed - No data to display  EKG None  Radiology Dg Chest 2 View  Result Date: 07/25/2019 CLINICAL DATA:  Fall EXAM: CHEST - 2 VIEW COMPARISON:  10/26/2010 FINDINGS: The heart size and mediastinal  contours are within normal limits. Both lungs are clear. The visualized skeletal structures are unremarkable. IMPRESSION: No active cardiopulmonary disease. Electronically Signed   By: Franchot Gallo M.D.   On: 07/25/2019 13:37   Dg Thoracic Spine 2 View  Result Date: 07/25/2019 CLINICAL DATA:  Fall.  Back pain EXAM: THORACIC SPINE 2 VIEWS COMPARISON:  None. FINDINGS: There is no evidence of thoracic spine fracture. Alignment is normal. No other significant bone abnormalities are identified. IMPRESSION: Negative. Electronically Signed   By: Franchot Gallo M.D.   On: 07/25/2019 13:37   Ct Head Wo Contrast  Result Date: 07/25/2019 CLINICAL DATA:  Tripped and fell with trauma to the head neck. EXAM: CT HEAD WITHOUT CONTRAST CT CERVICAL SPINE WITHOUT CONTRAST TECHNIQUE: Multidetector CT imaging of the head and cervical spine was performed following the standard protocol without intravenous contrast. Multiplanar CT image reconstructions of the cervical spine were also generated. COMPARISON:  None. FINDINGS: CT HEAD FINDINGS Brain: The brain shows a normal appearance without evidence of malformation, atrophy, old or acute small or large vessel infarction, mass lesion, hemorrhage, hydrocephalus or extra-axial collection. Vascular: No hyperdense vessel. No evidence of atherosclerotic calcification. Skull: Normal.  No traumatic finding.  No focal bone lesion. Sinuses/Orbits: Sinuses are clear. Orbits appear normal. Mastoids are clear. Other: None significant CT CERVICAL SPINE FINDINGS Alignment: Normal Skull base and vertebrae: Normal Soft tissues and spinal canal: Normal Disc levels:  Normal Upper chest: Normal Other: None IMPRESSION: Head CT: Normal. Cervical spine CT: Normal. Electronically Signed   By: Nelson Chimes M.D.   On: 07/25/2019 13:18   Ct Cervical Spine Wo Contrast  Result Date: 07/25/2019 CLINICAL DATA:  Tripped and fell with trauma to the head neck. EXAM: CT HEAD WITHOUT CONTRAST CT CERVICAL SPINE  WITHOUT CONTRAST  TECHNIQUE: Multidetector CT imaging of the head and cervical spine was performed following the standard protocol without intravenous contrast. Multiplanar CT image reconstructions of the cervical spine were also generated. COMPARISON:  None. FINDINGS: CT HEAD FINDINGS Brain: The brain shows a normal appearance without evidence of malformation, atrophy, old or acute small or large vessel infarction, mass lesion, hemorrhage, hydrocephalus or extra-axial collection. Vascular: No hyperdense vessel. No evidence of atherosclerotic calcification. Skull: Normal.  No traumatic finding.  No focal bone lesion. Sinuses/Orbits: Sinuses are clear. Orbits appear normal. Mastoids are clear. Other: None significant CT CERVICAL SPINE FINDINGS Alignment: Normal Skull base and vertebrae: Normal Soft tissues and spinal canal: Normal Disc levels:  Normal Upper chest: Normal Other: None IMPRESSION: Head CT: Normal. Cervical spine CT: Normal. Electronically Signed   By: Nelson Chimes M.D.   On: 07/25/2019 13:18    Procedures Procedures (including critical care time)  Medications Ordered in ED Medications  HYDROcodone-acetaminophen (NORCO/VICODIN) 5-325 MG per tablet 1 tablet (1 tablet Oral Given 07/25/19 1225)  bacitracin ointment (1 application Topical Given 07/25/19 1226)     Initial Impression / Assessment and Plan / ED Course  I have reviewed the triage vital signs and the nursing notes.  Pertinent labs & imaging results that were available during my care of the patient were reviewed by me and considered in my medical decision making (see chart for details).  28 yo F who presents via EMS after a fall at work when she was chasing a child that had ran out of the school.  Patient states that she tried to turn so that she would not fall onto the child and struck the back of her head and her upper back hard on the concrete.  She is not sure if she lost consciousness but has had a worsening headache since the  fall.  No vision changes or vomiting.  She also reports some pain in her mid thoracic spine and has some tenderness and pain at the bottom of her C-spine as well, c-collar applied by EMS.  She has a small scrape to the heel of her left hand, tetanus is up-to-date and this was cleaned and bandaged with bacitracin.  She does not have any bony tenderness or swelling over the left wrist and is able to move it without difficulty.  She has some tenderness over the left upper chest wall without palpable deformity.  Will get CT scans of the head and C-spine as well as x-rays of the chest and thoracic spine.  Radiology without acute abnormality.  Patient is able to ambulate without difficulty in the ED.  Pt is hemodynamically stable, in NAD.   Pain has been managed & pt has no complaints prior to dc.  Patient counseled on typical course of muscle stiffness and soreness after a fall. Discussed s/s that should cause them to return. Patient instructed on NSAID use. Instructed that prescribed medicine can cause drowsiness and they should not work, drink alcohol, or drive while taking this medicine. Encouraged PCP follow-up for recheck if symptoms are not improved in one week.. Patient verbalized understanding and agreed with the plan. D/c to home    Final Clinical Impressions(s) / ED Diagnoses   Final diagnoses:  Fall, initial encounter  Injury of head, initial encounter  Neck pain  Acute midline thoracic back pain  Abrasion    ED Discharge Orders         Ordered    ibuprofen (ADVIL) 600 MG tablet  Every 6 hours  PRN     07/25/19 1346    methocarbamol (ROBAXIN) 500 MG tablet  2 times daily     07/25/19 1346           Jacqlyn Larsen, [place] 07/25/19 [place], [place], DO 07/25/19 1538

## 2019-07-25 NOTE — ED Notes (Signed)
Patient verbalizes understanding of discharge instructions. Opportunity for questioning and answers were provided. Armband removed by staff, pt discharged from ED.  

## 2019-09-26 ENCOUNTER — Encounter: Payer: Self-pay | Admitting: *Deleted

## 2019-09-27 ENCOUNTER — Ambulatory Visit: Payer: BC Managed Care – PPO | Admitting: Diagnostic Neuroimaging

## 2019-09-27 ENCOUNTER — Other Ambulatory Visit: Payer: Self-pay

## 2019-09-27 ENCOUNTER — Encounter: Payer: Self-pay | Admitting: Diagnostic Neuroimaging

## 2019-09-27 VITALS — BP 103/61 | HR 84 | Temp 97.5°F | Ht 65.0 in | Wt 134.2 lb

## 2019-09-27 DIAGNOSIS — G43109 Migraine with aura, not intractable, without status migrainosus: Secondary | ICD-10-CM

## 2019-09-27 DIAGNOSIS — G44309 Post-traumatic headache, unspecified, not intractable: Secondary | ICD-10-CM | POA: Diagnosis not present

## 2019-09-27 NOTE — Patient Instructions (Signed)
  POST-CONCUSSION HEADACHES (vs migraine with aura) - check MRI brain - OTC ibuprofen / tylenol as needed

## 2019-09-27 NOTE — Progress Notes (Signed)
[facility]  PATIENT: Dawn Zamora DOB: 1990-09-29  REFERRING CLINICIAN: Glenford Bayley, DO HISTORY FROM: patient  REASON FOR VISIT: new consult    HISTORICAL  CHIEF COMPLAINT:  Chief Complaint  Patient presents with  . Daily Headache    rm 6 New Pt  "daily headaches started 09/17/19, almost constant and can happen at any time; hx of rare headaches; haven't tried OTC"    HISTORY OF PRESENT ILLNESS:   29 year old female here for evaluation of headaches.  December 2020 patient was at work, tripped and fell and hit her head and back.  Patient went to emergency room for evaluation was diagnosed with concussion.  Patient had some headache and back pain for 1 to 2 weeks which then improved.  1 to 2 weeks later patient had onset of dull midline throbbing headache, sometimes associate with nausea and photophobia.  Headaches occurred in daily basis since that time, around August 21, 2019.  Sometimes sees some spots and sparkles.  She has not tried any medications.  No problems with arms or legs.  No other triggering or aggravating factors.  No prior history of migraine.    REVIEW OF SYSTEMS: Full 14 system review of systems performed and negative with exception of: As per HPI.  ALLERGIES: Allergies  Allergen Reactions  . Aspirin Shortness Of Breath    Patient told not to take aspirin due to respiratory distress.  . Coconut Oil Anaphylaxis  . Lemon Oil Hives  . Lemon Balm [Melissa Officinalis] Hives  . Metronidazole Nausea And Vomiting    diarrhea  . Copper-Containing Compounds Rash  . Nickel Rash    HOME MEDICATIONS: Outpatient Medications Prior to Visit  Medication Sig Dispense Refill  . albuterol (PROVENTIL HFA;VENTOLIN HFA) 108 (90 Base) MCG/ACT inhaler Inhale 1-2 puffs into the lungs every 6 (six) hours as needed for wheezing or shortness of breath.    . hydrOXYzine (ATARAX/VISTARIL) 25 MG tablet Take 1 tablet (25 mg total) by mouth 3 (three) times daily  as needed for anxiety. (Patient not taking: Reported on 11/17/2017) 30 tablet 0  . ibuprofen (ADVIL) 600 MG tablet Take 1 tablet (600 mg total) by mouth every 6 (six) hours as needed. (Patient not taking: Reported on 09/27/2019) 30 tablet 0  . levocetirizine (XYZAL) 5 MG tablet     . methocarbamol (ROBAXIN) 500 MG tablet Take 1 tablet (500 mg total) by mouth 2 (two) times daily. (Patient not taking: Reported on 09/27/2019) 20 tablet 0  . naproxen (NAPROSYN) 500 MG tablet Take 1 tablet (500 mg total) by mouth 2 (two) times daily. (Patient not taking: Reported on 09/27/2019) 30 tablet 0  . norethindrone (AYGESTIN) 5 MG tablet Take 1 tablet (5 mg total) by mouth daily. (Patient not taking: Reported on 11/11/2018) 7 tablet 0  . traZODone (DESYREL) 50 MG tablet Take 1 tablet (50 mg total) by mouth at bedtime as needed for sleep. (Patient not taking: Reported on 11/17/2017) 30 tablet 0   No facility-administered medications prior to visit.    PAST MEDICAL HISTORY: Past Medical History:  Diagnosis Date  . Anemia   . Anxiety   . Asthma   . Dysrhythmia   . Generalized headaches   . Hx of chlamydia infection     PAST SURGICAL HISTORY: Past Surgical History:  Procedure Laterality Date  . NO PAST SURGERIES      FAMILY HISTORY: Family History  Problem Relation Age of Onset  . Polydactyly Paternal Uncle   . Sickle  cell anemia Cousin   . Diabetes Mother   . Diabetes Maternal Aunt   . Diabetes Maternal Grandmother   . Diabetes Maternal Grandfather     SOCIAL HISTORY: Social History   Socioeconomic History  . Marital status: Single    Spouse name: Not on file  . Number of children: 1  . Years of education: Not on file  . Highest education level: Bachelor's degree (e.g., BA, AB, BS)  Occupational History    Comment: student - Masters program  Tobacco Use  . Smoking status: Former Smoker    Quit date: 10/20/2010    Years since quitting: 8.9  . Smokeless tobacco: Never Used  Substance and  Sexual Activity  . Alcohol use: Not Currently  . Drug use: No  . Sexual activity: Yes    Birth control/protection: None  Other Topics Concern  . Not on file  Social History Narrative   Lives with child   Caffeine- none   Social Determinants of Health   Financial Resource Strain:   . Difficulty of Paying Living Expenses: Not on file  Food Insecurity:   . Worried About Charity fundraiser in the Last Year: Not on file  . Ran Out of Food in the Last Year: Not on file  Transportation Needs: No Transportation Needs  . Lack of Transportation (Medical): No  . Lack of Transportation (Non-Medical): No  Physical Activity:   . Days of Exercise per Week: Not on file  . Minutes of Exercise per Session: Not on file  Stress:   . Feeling of Stress : Not on file  Social Connections:   . Frequency of Communication with Friends and Family: Not on file  . Frequency of Social Gatherings with Friends and Family: Not on file  . Attends Religious Services: Not on file  . Active Member of Clubs or Organizations: Not on file  . Attends Archivist Meetings: Not on file  . Marital Status: Not on file  Intimate Partner Violence:   . Fear of Current or Ex-Partner: Not on file  . Emotionally Abused: Not on file  . Physically Abused: Not on file  . Sexually Abused: Not on file     PHYSICAL EXAM  GENERAL EXAM/CONSTITUTIONAL: Vitals:  Vitals:   09/27/19 1314  BP: 103/61  Pulse: 84  Temp: (!) 97.5 F (36.4 C)  Weight: 134 lb 3.2 oz (60.9 kg)  Height: 5\' 5"  (1.651 m)     Body mass index is 22.33 kg/m. Wt Readings from Last 3 Encounters:  09/27/19 134 lb 3.2 oz (60.9 kg)  04/07/19 129 lb 14.4 oz (58.9 kg)  11/11/18 127 lb (57.6 kg)     Patient is in no distress; well developed, nourished and groomed; neck is supple  CARDIOVASCULAR:  Examination of carotid arteries is normal; no carotid bruits  Regular rate and rhythm, no murmurs  Examination of peripheral vascular system  by observation and palpation is normal  EYES:  Ophthalmoscopic exam of optic discs and posterior segments is normal; no papilledema or hemorrhages  No exam data present  MUSCULOSKELETAL:  Gait, strength, tone, movements noted in Neurologic exam below  NEUROLOGIC: MENTAL STATUS:  No flowsheet data found.  awake, alert, oriented to person, place and time  recent and remote memory intact  normal attention and concentration  language fluent, comprehension intact, naming intact  fund of knowledge appropriate  CRANIAL NERVE:   2nd - no papilledema on fundoscopic exam  2nd, 3rd, 4th, 6th - pupils  equal and reactive to light, visual fields full to confrontation, extraocular muscles intact, no nystagmus  5th - facial sensation symmetric  7th - facial strength symmetric  8th - hearing intact  9th - palate elevates symmetrically, uvula midline  11th - shoulder shrug symmetric  12th - tongue protrusion midline  MOTOR:   normal bulk and tone, full strength in the BUE, BLE  SENSORY:   normal and symmetric to light touch, temperature, vibration  COORDINATION:   finger-nose-finger, fine finger movements normal  REFLEXES:   deep tendon reflexes present and symmetric  GAIT/STATION:   narrow based gait     DIAGNOSTIC DATA (LABS, IMAGING, TESTING) - I reviewed patient records, labs, notes, testing and imaging myself where available.  Lab Results  Component Value Date   WBC 5.2 09/29/2018   HGB 10.8 (L) 09/29/2018   HCT 34.9 (L) 09/29/2018   MCV 84.7 09/29/2018   PLT 336 09/29/2018      Component Value Date/Time   NA 140 09/29/2018 1226   K 3.8 09/29/2018 1226   CL 105 09/29/2018 1226   CO2 25 09/29/2018 1226   GLUCOSE 102 (H) 09/29/2018 1226   BUN 6 09/29/2018 1226   CREATININE 0.88 09/29/2018 1226   CALCIUM 9.3 09/29/2018 1226   GFRNONAA >60 09/29/2018 1226   GFRAA >60 09/29/2018 1226   No results found for: CHOL, HDL, LDLCALC, LDLDIRECT, TRIG,  CHOLHDL No results found for: HGBA1C No results found for: VITAMINB12 No results found for: TSH   07/25/19 CT head/cervical spine [I reviewed images myself and agree with interpretation. -VRP]  -Negative    ASSESSMENT AND PLAN  29 y.o. year old female here with :   Dx:  1. Post-concussion headache   2. Migraine with aura and without status migrainosus, not intractable     PLAN:  POST-CONCUSSION HEADACHES (vs migraine with aura) - check MRI brain (rule out other secondary causes) - OTC ibuprofen / tylenol as needed; may consider migraine rx in future  Orders Placed This Encounter  Procedures  . MR BRAIN W WO CONTRAST   Return for pending if symptoms worsen or fail to improve.    Penni Bombard, MD [id number], [id number] PM Certified in Neurology, Neurophysiology and Neuroimaging  [facility] [address] [place], [place] [address] [phone]

## 2019-10-18 ENCOUNTER — Telehealth: Payer: Self-pay | Admitting: Diagnostic Neuroimaging

## 2019-10-18 NOTE — Telephone Encounter (Signed)
BCBS Auth: [id number] (exp. 10/01/19 to 03/28/20) patient is scheduled at [facility] for 10/25/19.

## 2019-10-25 ENCOUNTER — Other Ambulatory Visit: Payer: BC Managed Care – PPO

## 2020-03-01 DIAGNOSIS — D259 Leiomyoma of uterus, unspecified: Secondary | ICD-10-CM | POA: Insufficient documentation

## 2020-04-05 ENCOUNTER — Other Ambulatory Visit: Payer: Self-pay | Admitting: Nurse Practitioner

## 2020-04-05 DIAGNOSIS — G4452 New daily persistent headache (NDPH): Secondary | ICD-10-CM

## 2020-04-26 ENCOUNTER — Ambulatory Visit
Admission: RE | Admit: 2020-04-26 | Discharge: 2020-04-26 | Disposition: A | Discharge status: Home / Self Care | Payer: BC Managed Care – PPO | Source: Ambulatory Visit | Attending: Nurse Practitioner | Admitting: Nurse Practitioner

## 2020-04-26 ENCOUNTER — Other Ambulatory Visit: Payer: Self-pay

## 2020-04-26 DIAGNOSIS — G4452 New daily persistent headache (NDPH): Secondary | ICD-10-CM

## 2020-05-10 ENCOUNTER — Ambulatory Visit: Payer: BC Managed Care – PPO | Admitting: Physical Therapy

## 2020-05-16 ENCOUNTER — Other Ambulatory Visit: Payer: Self-pay

## 2020-05-16 ENCOUNTER — Ambulatory Visit: Payer: BC Managed Care – PPO | Attending: Obstetrics and Gynecology | Admitting: Physical Therapy

## 2020-05-16 ENCOUNTER — Encounter: Payer: Self-pay | Admitting: Physical Therapy

## 2020-05-16 DIAGNOSIS — G8929 Other chronic pain: Secondary | ICD-10-CM | POA: Insufficient documentation

## 2020-05-16 DIAGNOSIS — M545 Low back pain, unspecified: Secondary | ICD-10-CM

## 2020-05-16 DIAGNOSIS — M6281 Muscle weakness (generalized): Secondary | ICD-10-CM | POA: Diagnosis present

## 2020-05-16 DIAGNOSIS — R293 Abnormal posture: Secondary | ICD-10-CM | POA: Insufficient documentation

## 2020-05-16 NOTE — Therapy (Addendum)
[facility] [address], [place], [place], [address] Phone: [phone]   Fax:  [phone]  Physical Therapy Evaluation  Patient Details  Name: Dawn Zamora MRN: [id number] Date of Birth: 08-29-1990 Referring Provider (PT): Delsa Bern, MD   Encounter Date: 05/16/2020   PT End of Session - 05/16/20 1510    Visit Number 1    Date for PT Re-Evaluation 08/08/20    Authorization Type BCBS mcaid family planning    PT Start Time 1317    PT Stop Time 1356    PT Time Calculation (min) 39 min    Activity Tolerance Patient tolerated treatment well    Behavior During Therapy [facility] for tasks assessed/performed           Past Medical History:  Diagnosis Date  . Anemia   . Anxiety   . Asthma   . Dysrhythmia   . Generalized headaches   . Hx of chlamydia infection     Past Surgical History:  Procedure Laterality Date  . NO PAST SURGERIES      There were no vitals filed for this visit.    Subjective Assessment - 05/16/20 1322    Subjective It has been bothering me more for the last few weeks.  Goes back to/from front and happens together.  Hurts to press on it sometimes.    Limitations Walking    How long can you walk comfortably? 20 minutes on a good day    Currently in Pain? Yes   no pain current   Pain Score 6    up to 6/10   Pain Location Groin    Pain Orientation Mid;Right    Pain Descriptors / Indicators Aching;Dull    Pain Type Chronic pain    Pain Onset More than a month ago   2016   Pain Frequency Intermittent    Aggravating Factors  walking a lot; swimming aggravated it    Pain Relieving Factors rest    Effect of Pain on Daily Activities just has to push through at work    Multiple Pain Sites No              [facility] PT Assessment - 05/16/20 0001      Assessment   Medical Diagnosis M53.3 (ICD-10-CM) - Sacroiliac joint pain; N76.0 (ICD-10-CM) - Vaginitis    Referring Provider (PT) Delsa Bern, MD    Onset Date/Surgical Date --   2016   Prior Therapy No      Precautions   Precautions None      Restrictions   Weight Bearing Restrictions No      Balance Screen   Has the patient fallen in the past 6 months Yes    How many times? 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      Cognition   Overall Cognitive Status Within Functional Limits for tasks assessed      Posture/Postural Control   Posture/Postural Control Postural limitations    Postural Limitations Increased lumbar lordosis;Anterior pelvic tilt    Posture Comments trunk leaning rt; Weight shifted to the Lt LE; Rotation Rt in supine; Rt hip elevated in standing      ROM /  Strength   AROM / PROM / Strength AROM;Strength      AROM   Overall AROM Comments lumbar flexion 50% and leans Rt as she returns to upright;       Strength   Overall Strength Comments Rt LE4/5 hip abduction, ext      Palpation   Palpation comment Rt SI TTP, lumbar paraspinals and gluteals Rt side tight and TTP; Rt QL tight and TTP      Special Tests    Special Tests Sacrolliac Tests    Other special tests palpation of Rt SI - TTP    Sacroiliac Tests  Sacral Thrust      Pelvic Dictraction   Findings Positive    Side  Right      Pelvic Compression   Findings Positive    Side Right      Sacral thrust    Findings Positive    Side Right                      Objective measurements completed on examination: See above findings.     Pelvic Floor Special Questions - 05/16/20 0001    Prior Pelvic/Prostate Exam Yes    Prior Pregnancies Yes    Number of Pregnancies 1    Number of Vaginal Deliveries 1    Currently Sexually Active No    Urinary Leakage Yes    How often not often now but  used to happen without knowing I woulds    Pad use no    Urinary urgency No    Urinary frequency every few hours    Fluid intake 5-6 glasses/ day    Falling out feeling (prolapse) No    External Palpation palpation outside clothing - pt able to contract palpated lifting and no observed co-contractions - will defer internal assessment unless needed in future visit            [place] Adult PT Treatment/Exercise - 05/16/20 0001      Self-Care   Self-Care Other Self-Care Comments    Other Self-Care Comments  intial HEP educated and performed                  PT Education - 05/16/20 1643    Education Details Access Code: [email or web address]    Person(s) Educated Patient    Methods Explanation;Demonstration;Verbal cues;Handout    Comprehension Verbalized understanding;Returned demonstration            PT Short Term Goals - 05/16/20 1527      PT SHORT TERM GOAL #1   Title ind with intitial HEP    Time 4    Period Weeks    Status New    Target Date 06/13/20             PT Long Term Goals - 05/16/20 1516      PT LONG TERM GOAL #1   Title Pt will be able to participate in recreational activities such as walk or swim for at least 1 hour due to improved pelvic stability.    Time 12    Period Weeks    Status New    Target Date 08/08/20      PT LONG TERM GOAL #2   Title Pt will have 5/5 hip abduction Lt LE for improved functional movement such as lifting and single leg standing without compensations    Baseline 4/5 MMT Lt hip abduction    Time 12    Period Weeks  Status New    Target Date 08/08/20      PT LONG TERM GOAL #3   Title Pt will have no difficulty with ASLR on either side to demonstrate improved pelvic stability    Baseline Lt side ASLR is hard; Rt no problem    Time 12    Period Weeks    Status New    Target Date 08/08/20      PT LONG TERM GOAL #4   Title Pt will report overall 60% less pain frequency and intensity    Time 12    Period Weeks    Status  New    Target Date 08/08/20                  Plan - 05/16/20 1527    Clinical Impression Statement Pt arrives to clinic today due to pain in SI joint and groin that she has had since her 29 y/o was an infant.  Pt tests positive for at least 4/5 SI joint test on Rt side.  Pt has DRA of 2 fingers at the umbilicus.  Pt has hip weakness of Rt side.  Posture deficits include weight shifted Lt and anterior pelvic tilt, Rt hip elevated in standing  Tight lumbar erectors and QL and gluteals on the Rt side.  Pt has pain with walking only 20 minutes and is limited in recreational activities due to pain.  Pt will benefit from skilled PT to address impairments and return to maximum funcitonal activities without pain.   Personal Factors and Comorbidities Time since onset of injury/illness/exacerbation    Stability/Clinical Decision Making Stable/Uncomplicated    Clinical Decision Making Moderate    Rehab Potential Excellent    PT Frequency 1x / week    PT Duration 12 weeks    PT Treatment/Interventions ADLs/Self Care Home Management;Biofeedback;Moist Heat;Electrical Stimulation;Cryotherapy;Therapeutic activities;Therapeutic exercise;Balance training;Neuromuscular re-education;Patient/family education;Manual techniques;Taping;Dry needling;Passive range of motion    PT Next Visit Plan hip stretches and strengthening, core strength, review HEP as needed; Lt glute and lumbar possibly DN?    PT Home Exercise Plan Access Code: [email or web address]    Consulted and Agree with Plan of Care Patient           Patient will benefit from skilled therapeutic intervention in order to improve the following deficits and impairments:  Pain, Postural dysfunction, Increased fascial restricitons, Decreased strength, Decreased coordination, Improper body mechanics, Decreased range of motion, Increased muscle spasms  Visit Diagnosis: Chronic left-sided low back pain without sciatica  Muscle weakness (generalized)  Abnormal  posture     Problem List Patient Active Problem List   Diagnosis Date Noted  . Adjustment disorder with depressed mood 02/15/2017  . NSVD (normal spontaneous vaginal delivery) 01/07/2014  . Normal labor 01/06/2014    Jule Ser, PT 05/16/2020, 4:56 PM  [facility] [address], [place], [place], [address] Phone: [phone]   Fax:  [phone]  Name: Dawn Zamora MRN: [id number] Date of Birth: 11-17-1990

## 2020-05-16 NOTE — Patient Instructions (Signed)
Access Code: [email or web address] URL: [email or web address] Date: 05/16/2020 Prepared by: Jari Favre  Exercises Seated Sidebending - 1 x daily - 7 x weekly - 3 sets - 10 reps Supine Posterior Pelvic Tilt - 1 x daily - 7 x weekly - 3 sets - 10 reps

## 2020-05-24 ENCOUNTER — Encounter: Payer: Self-pay | Admitting: Physical Therapy

## 2020-05-24 ENCOUNTER — Other Ambulatory Visit: Payer: Self-pay

## 2020-05-24 ENCOUNTER — Ambulatory Visit: Payer: BC Managed Care – PPO | Attending: Obstetrics and Gynecology | Admitting: Physical Therapy

## 2020-05-24 DIAGNOSIS — M6281 Muscle weakness (generalized): Secondary | ICD-10-CM | POA: Diagnosis present

## 2020-05-24 DIAGNOSIS — R293 Abnormal posture: Secondary | ICD-10-CM | POA: Diagnosis present

## 2020-05-24 DIAGNOSIS — M545 Low back pain, unspecified: Secondary | ICD-10-CM | POA: Diagnosis present

## 2020-05-24 DIAGNOSIS — G8929 Other chronic pain: Secondary | ICD-10-CM | POA: Insufficient documentation

## 2020-05-24 NOTE — Therapy (Signed)
[facility] [address], [place], [place], [address] Phone: [phone]   Fax:  [phone]  Physical Therapy Treatment  Patient Details  Name: Dawn Zamora MRN: [id number] Date of Birth: 01/28/1991 Referring Provider (PT): Delsa Bern, MD   Encounter Date: 05/24/2020   PT End of Session - 05/24/20 1453    Visit Number 2    Date for PT Re-Evaluation 08/08/20    Authorization Type BCBS mcaid family planning    PT Start Time 1447    PT Stop Time 1527    PT Time Calculation (min) 40 min    Activity Tolerance Patient tolerated treatment well    Behavior During Therapy [facility] for tasks assessed/performed           Past Medical History:  Diagnosis Date  . Anemia   . Anxiety   . Asthma   . Dysrhythmia   . Generalized headaches   . Hx of chlamydia infection     Past Surgical History:  Procedure Laterality Date  . NO PAST SURGERIES      There were no vitals filed for this visit.   Subjective Assessment - 05/24/20 1451    Subjective Pt states she has done the exercises a couple of times and not sure, maybe felt like it aggravated it.  Pt states she is not noticing pain today.    How long can you walk comfortably? 20 minutes on a good day    Currently in Pain? No/denies                             [facility] Adult PT Treatment/Exercise - 05/24/20 0001      Exercises   Exercises Lumbar      Lumbar Exercises: Stretches   Lower Trunk Rotation 10 seconds;5 reps    Pelvic Tilt 10 reps    Figure 4 Stretch 5 reps;10 seconds;With overpressure;Supine      Lumbar Exercises: Supine   AB Set Limitations activate TrA with bent knee drop outs and during movements when doing the stretches - 10x      Manual Therapy   Manual Therapy Soft tissue mobilization    Soft tissue mobilization lumbar fascial release and STM to paraspinals and gluteals            Trigger Point Dry Needling - 05/24/20 0001     Consent Given? Yes    Education Handout Provided Yes    Muscles Treated Back/Hip Lumbar multifidi    Lumbar multifidi Response Twitch response elicited;Palpable increased muscle length   L3/4 bilat               PT Education - 05/24/20 1732    Education Details Access Code: [email or web address]    Person(s) Educated Patient    Methods Explanation;Demonstration;Tactile cues;Verbal cues;Handout    Comprehension Returned demonstration;Verbalized understanding            PT Short Term Goals - 05/16/20 1527      PT SHORT TERM GOAL #1   Title ind with intitial HEP    Time 4    Period Weeks    Status New    Target Date 06/13/20             PT Long Term Goals - 05/16/20 1516      PT LONG TERM GOAL #1   Title Pt will be able to participate in recreational activities such as walk or swim for  at least 1 hour due to improved pelvic stability.    Time 12    Period Weeks    Status New    Target Date 08/08/20      PT LONG TERM GOAL #2   Title Pt will have 5/5 hip abduction Lt LE for improved functional movement such as lifting and single leg standing without compensations    Baseline 4/5 MMT Lt hip abduction    Time 12    Period Weeks    Status New    Target Date 08/08/20      PT LONG TERM GOAL #3   Title Pt will have no difficulty with ASLR on either side to demonstrate improved pelvic stability    Baseline Lt side ASLR is hard; Rt no problem    Time 12    Period Weeks    Status New    Target Date 08/08/20      PT LONG TERM GOAL #4   Title Pt will report overall 60% less pain frequency and intensity    Time 12    Period Weeks    Status New    Target Date 08/08/20                 Plan - 05/24/20 1618    Clinical Impression Statement Pt felt more relaxed after today's session but only slightly reporting " I think I am looser".  Pt did not have noticeable symptoms when coming to today's treatment.  Pt was able to add basic core strengthening and hip stretches as  seen in HEP.  Pt will continue to benefit from core strength for improved stabilty when walking and decreased pain.    PT Treatment/Interventions ADLs/Self Care Home Management;Biofeedback;Moist Heat;Electrical Stimulation;Cryotherapy;Therapeutic activities;Therapeutic exercise;Balance training;Neuromuscular re-education;Patient/family education;Manual techniques;Taping;Dry needling;Passive range of motion    PT Next Visit Plan f/u on stretches and bent knee fallouts; sacral fascial release, f/u on response to DN maybe add Lt gluteals; maybe addaday    PT Home Exercise Plan Access Code: [email or web address]    Consulted and Agree with Plan of Care Patient           Patient will benefit from skilled therapeutic intervention in order to improve the following deficits and impairments:  Pain, Postural dysfunction, Increased fascial restricitons, Decreased strength, Decreased coordination, Improper body mechanics, Decreased range of motion, Increased muscle spasms  Visit Diagnosis: Chronic left-sided low back pain without sciatica  Muscle weakness (generalized)  Abnormal posture     Problem List Patient Active Problem List   Diagnosis Date Noted  . Adjustment disorder with depressed mood 02/15/2017  . NSVD (normal spontaneous vaginal delivery) 01/07/2014  . Normal labor 01/06/2014    Jule Ser, PT 05/24/2020, 5:33 PM  [facility] [address] [place], [place], [address] Phone: [phone]   Fax:  [phone]  Name: Dawn Zamora MRN: [id number] Date of Birth: September 03, 1990

## 2020-05-24 NOTE — Patient Instructions (Signed)
Access Code: [email or web address] URL: [email or web address] Date: 05/24/2020 Prepared by: Jari Favre  Exercises Supine Posterior Pelvic Tilt - 1 x daily - 7 x weekly - 3 sets - 10 reps Supine Lower Trunk Rotation - 1 x daily - 7 x weekly - 1 sets - 10 reps - 5 sec hold Supine Piriformis Stretch - 1 x daily - 7 x weekly - 3 reps - 1 sets - 30 sec hold Supine Butterfly Groin Stretch - 1 x daily - 7 x weekly - 3 reps - 1 sets - 30 sec hold Bent Knee Fallouts - 1 x daily - 7 x weekly - 1 sets - 10 reps  Patient Education Trigger Point Dry Needling

## 2020-05-29 ENCOUNTER — Encounter: Payer: Self-pay | Admitting: Physical Therapy

## 2020-05-29 ENCOUNTER — Other Ambulatory Visit: Payer: Self-pay

## 2020-05-29 ENCOUNTER — Ambulatory Visit: Payer: BC Managed Care – PPO | Admitting: Physical Therapy

## 2020-05-29 DIAGNOSIS — G8929 Other chronic pain: Secondary | ICD-10-CM

## 2020-05-29 DIAGNOSIS — M545 Low back pain, unspecified: Secondary | ICD-10-CM | POA: Diagnosis not present

## 2020-05-29 DIAGNOSIS — M6281 Muscle weakness (generalized): Secondary | ICD-10-CM

## 2020-05-29 DIAGNOSIS — R293 Abnormal posture: Secondary | ICD-10-CM

## 2020-05-29 NOTE — Therapy (Signed)
[facility] [address], [place], [place], [address] Phone: [phone]   Fax:  [phone]  Physical Therapy Treatment  Patient Details  Name: Dawn Zamora MRN: [id number] Date of Birth: [birth year] Referring Provider (PT): Delsa Bern, MD   Encounter Date: 05/29/2020   PT End of Session - 05/29/20 1533    Visit Number 3    Date for PT Re-Evaluation 08/08/20    Authorization Type BCBS mcaid family planning    PT Start Time 65    PT Stop Time 1615    PT Time Calculation (min) 41 min    Activity Tolerance Patient tolerated treatment well    Behavior During Therapy Dawn Zamora           Past Medical History:  Diagnosis Date  . Anemia   . Anxiety   . Asthma   . Dysrhythmia   . Generalized headaches   . Hx of chlamydia infection     Past Surgical History:  Procedure Laterality Date  . NO PAST SURGERIES      There were no vitals filed for this visit.   Subjective Assessment - 05/29/20 2056    Subjective I had pain after sleeping in a strange position all weekend    Currently in Pain? Yes    Pain Score 3     Pain Location Groin    Pain Orientation Right    Pain Descriptors / Indicators Aching    Pain Type Chronic pain    Pain Radiating Towards to SI joint    Pain Onset More than a month ago    Pain Frequency Intermittent    Aggravating Factors  walking and sleeping wrong    Multiple Pain Sites No                             [facility] Adult PT Treatment/Exercise - 05/29/20 0001      Lumbar Exercises: Stretches   Lower Trunk Rotation 10 seconds;5 reps    Other Lumbar Stretch Exercise sidelying rotation to Lt (upper left)    Other Lumbar Stretch Exercise seated thoracic stretch Lt      Lumbar Exercises: Standing   Other Standing Lumbar Exercises cue to activate gluteals during gait and no trunk lean      Lumbar Exercises: Seated   Other Seated Lumbar  Exercises horizontal abduction yellow cue to activate TrA      Lumbar Exercises: Supine   Other Supine Lumbar Exercises hip abduction yellow; ball squeeze - 20x      Manual Therapy   Manual Therapy Soft tissue mobilization;Joint mobilization;Muscle Energy Technique    Joint Mobilization Lt sidelying rotation to L1-3    Soft tissue mobilization gluteals bilateral    Muscle Energy Technique Lt posterior ilium rotation facilitated; Rt anterior                  PT Education - 05/29/20 2107    Education Details Access Code: [email or web address]    Person(s) Educated Patient    Methods Explanation;Demonstration;Tactile cues;Verbal cues;Handout    Comprehension Verbalized understanding;Returned demonstration            PT Short Term Goals - 05/16/20 1527      PT SHORT TERM GOAL #1   Title ind with intitial HEP    Time 4    Period Weeks    Status New    Target Date 06/13/20  PT Long Term Goals - 05/16/20 1516      PT LONG TERM GOAL #1   Title Pt will be able to participate in recreational activities such as walk or swim for at least 1 hour due to improved pelvic stability.    Time 12    Period Weeks    Status New    Target Date 08/08/20      PT LONG TERM GOAL #2   Title Pt will have 5/5 hip abduction Lt LE for improved functional movement such as lifting and single leg standing without compensations    Baseline 4/5 MMT Lt hip abduction    Time 12    Period Weeks    Status New    Target Date 08/08/20      PT LONG TERM GOAL #3   Title Pt will have no difficulty with ASLR on either side to demonstrate improved pelvic stability    Baseline Lt side ASLR is hard; Rt no problem    Time 12    Period Weeks    Status New    Target Date 08/08/20      PT LONG TERM GOAL #4   Title Pt will report overall 60% less pain frequency and intensity    Time 12    Period Weeks    Status New    Target Date 08/08/20                 Plan - 05/29/20 2108    Clinical  Impression Statement Today's session focused on posture alignment due to patient with anterior rotation of Lt ilium and Rt rotation of lumbar L1-3.  Pt demonstrates improved posture after sidelying lumbar mobs and MET to rotate ilium.  Pt was given exercises for core and glute activation to improve sitting, standing and walking gait.  Pt will benefit from skilled PT to continue to work on posture for reduced hip and groin pain during typical daily and work activities.    PT Treatment/Interventions ADLs/Self Care Home Management;Biofeedback;Moist Heat;Electrical Stimulation;Cryotherapy;Therapeutic activities;Therapeutic exercise;Balance training;Neuromuscular re-education;Patient/family education;Manual techniques;Taping;Dry needling;Passive range of motion    PT Next Visit Plan squats, standing, gait, glute activation, check ilium for rotation and MET as needed, DN maybe gluteals and lumbar, maybe addaday as needed    PT Home Exercise Plan Access Code: [email or web address]    Consulted and Agree with Plan of Care Patient           Patient will benefit from skilled therapeutic intervention in order to improve the following deficits and impairments:  Pain, Postural dysfunction, Increased fascial restricitons, Decreased strength, Decreased coordination, Improper body mechanics, Decreased range of motion, Increased muscle spasms  Visit Diagnosis: Chronic left-sided low back pain without sciatica  Muscle weakness (generalized)  Abnormal posture     Problem List Patient Active Problem List   Diagnosis Date Noted  . Adjustment disorder with depressed mood 02/15/2017  . NSVD (normal spontaneous vaginal delivery) 01/07/2014  . Normal labor 01/06/2014    Dawn Zamora, PT 05/29/2020, 9:38 PM  [facility] [address] [place], [place], [address] Phone: [phone]   Fax:  [phone]  Name: Dawn Zamora MRN: [id number] Date of  Birth: 1990-10-01

## 2020-05-29 NOTE — Patient Instructions (Signed)
Access Code: [email or web address] URL: [email or web address] Date: 05/29/2020 Prepared by: Jari Favre  Exercises Supine Posterior Pelvic Tilt - 1 x daily - 7 x weekly - 3 sets - 10 reps Supine Piriformis Stretch - 1 x daily - 7 x weekly - 3 reps - 1 sets - 30 sec hold Supine Butterfly Groin Stretch - 1 x daily - 7 x weekly - 3 reps - 1 sets - 30 sec hold Bent Knee Fallouts - 1 x daily - 7 x weekly - 1 sets - 10 reps Supine Lower Trunk Rotation - 1 x daily - 7 x weekly - 1 sets - 10 reps - 5 sec hold Sidelying Thoracic Rotation with Open Book - 1 x daily - 7 x weekly - 1 sets - 5 reps - 10 sec hold Supine Hip Adductor Squeeze with Small Ball - 1 x daily - 7 x weekly - 2 sets - 10 reps - 5 sec hold Hooklying Clamshells with Resistance - 1 x daily - 7 x weekly - 3 sets - 10 reps  Patient Education Trigger Point Dry Needling

## 2020-06-05 ENCOUNTER — Other Ambulatory Visit: Payer: Self-pay

## 2020-06-05 ENCOUNTER — Ambulatory Visit: Payer: BC Managed Care – PPO | Admitting: Physical Therapy

## 2020-06-05 DIAGNOSIS — M545 Low back pain, unspecified: Secondary | ICD-10-CM

## 2020-06-05 DIAGNOSIS — G8929 Other chronic pain: Secondary | ICD-10-CM

## 2020-06-05 DIAGNOSIS — M6281 Muscle weakness (generalized): Secondary | ICD-10-CM

## 2020-06-05 DIAGNOSIS — R293 Abnormal posture: Secondary | ICD-10-CM

## 2020-06-05 NOTE — Therapy (Signed)
[facility] [address], [place], [place], [address] Phone: [phone]   Fax:  [phone]  Physical Therapy Treatment  Patient Details  Name: Dawn Zamora MRN: [id number] Date of Birth: 09/18/1990 Referring Provider (PT): Delsa Bern, MD   Encounter Date: 06/05/2020   PT End of Session - 06/05/20 1537    Visit Number 4    Date for PT Re-Evaluation 08/08/20    Authorization Type BCBS mcaid family planning    PT Start Time 1535    PT Stop Time 1615    PT Time Calculation (min) 40 min    Activity Tolerance Patient tolerated treatment well    Behavior During Therapy [facility] for tasks assessed/performed           Past Medical History:  Diagnosis Date  . Anemia   . Anxiety   . Asthma   . Dysrhythmia   . Generalized headaches   . Hx of chlamydia infection     Past Surgical History:  Procedure Laterality Date  . NO PAST SURGERIES      There were no vitals filed for this visit.   Subjective Assessment - 06/05/20 1546    Subjective I think the stretches are easier    How long can you walk comfortably? 20 minutes on a good day    Currently in Pain? No/denies                             [facility] Adult PT Treatment/Exercise - 06/05/20 0001      Lumbar Exercises: Standing   Other Standing Lumbar Exercises QL stretch    Other Standing Lumbar Exercises side bend on ball      Lumbar Exercises: Seated   Other Seated Lumbar Exercises thoracic ext and rotation      Lumbar Exercises: Sidelying   Other Sidelying Lumbar Exercises thoracic rotation Lt      Manual Therapy   Joint Mobilization Lt sidelying rotation to L1-3; T10-12    Soft tissue mobilization gluteals Rt            Trigger Point Dry Needling - 06/05/20 0001    Consent Given? Yes    Education Handout Provided Previously provided    Muscles Treated Back/Hip Gluteus minimus;Gluteus medius;Gluteus maximus    Gluteus Minimus  Response Twitch response elicited;Palpable increased muscle length                PT Education - 06/05/20 1710    Education Details [id number]    Person(s) Educated Patient    Methods Explanation;Demonstration;Verbal cues;Handout;Tactile cues    Comprehension Verbalized understanding;Returned demonstration            PT Short Term Goals - 06/05/20 1716      PT SHORT TERM GOAL #1   Title ind with intitial HEP    Status Achieved             PT Long Term Goals - 05/16/20 1516      PT LONG TERM GOAL #1   Title Pt will be able to participate in recreational activities such as walk or swim for at least 1 hour due to improved pelvic stability.    Time 12    Period Weeks    Status New    Target Date 08/08/20      PT LONG TERM GOAL #2   Title Pt will have 5/5 hip abduction Lt LE for improved functional movement  such as lifting and single leg standing without compensations    Baseline 4/5 MMT Lt hip abduction    Time 12    Period Weeks    Status New    Target Date 08/08/20      PT LONG TERM GOAL #3   Title Pt will have no difficulty with ASLR on either side to demonstrate improved pelvic stability    Baseline Lt side ASLR is hard; Rt no problem    Time 12    Period Weeks    Status New    Target Date 08/08/20      PT LONG TERM GOAL #4   Title Pt will report overall 60% less pain frequency and intensity    Time 12    Period Weeks    Status New    Target Date 08/08/20                 Plan - 06/05/20 1712    Clinical Impression Statement Pt is doing better with HEP.  Pt has less pelvic rotation at beginning of treatment than previous visit and even more improved after today's treatment.  today's session Focused on thoracic mobilty and gluteal muscle length.  Pt will benefit from skilled PT to continue to work on imrpoved soft tissue length for improved posture for pain management and return to maximum functional activities.    PT Treatment/Interventions  ADLs/Self Care Home Management;Biofeedback;Moist Heat;Electrical Stimulation;Cryotherapy;Therapeutic activities;Therapeutic exercise;Balance training;Neuromuscular re-education;Patient/family education;Manual techniques;Taping;Dry needling;Passive range of motion    PT Next Visit Plan f/u on response to dry needle gluteals; STM and DN Rt gluteals; check thoracic and ilium rotation; modified qped hip strength    PT Home Exercise Plan Access Code: [email or web address]    Consulted and Agree with Plan of Care Patient           Patient will benefit from skilled therapeutic intervention in order to improve the following deficits and impairments:  Pain, Postural dysfunction, Increased fascial restricitons, Decreased strength, Decreased coordination, Improper body mechanics, Decreased range of motion, Increased muscle spasms  Visit Diagnosis: Chronic left-sided low back pain without sciatica  Muscle weakness (generalized)  Abnormal posture     Problem List Patient Active Problem List   Diagnosis Date Noted  . Adjustment disorder with depressed mood 02/15/2017  . NSVD (normal spontaneous vaginal delivery) 01/07/2014  . Normal labor 01/06/2014    Jule Ser, PT 06/05/2020, 5:19 PM  [facility] [address] [place], [place], [address] Phone: [phone]   Fax:  [phone]  Name: Dawn Zamora MRN: [id number] Date of Birth: 1991-04-18

## 2020-06-05 NOTE — Patient Instructions (Signed)
Access Code: [email or web address] URL: [email or web address] Date: 06/05/2020 Prepared by: Jari Favre  Exercises Supine Posterior Pelvic Tilt - 1 x daily - 7 x weekly - 3 sets - 10 reps Supine Piriformis Stretch - 1 x daily - 7 x weekly - 3 reps - 1 sets - 30 sec hold Supine Butterfly Groin Stretch - 1 x daily - 7 x weekly - 3 reps - 1 sets - 30 sec hold Bent Knee Fallouts - 1 x daily - 7 x weekly - 1 sets - 10 reps Supine Lower Trunk Rotation - 1 x daily - 7 x weekly - 1 sets - 10 reps - 5 sec hold Sidelying Thoracic Rotation with Open Book - 1 x daily - 7 x weekly - 1 sets - 5 reps - 10 sec hold Supine Hip Adductor Squeeze with Small Ball - 1 x daily - 7 x weekly - 2 sets - 10 reps - 5 sec hold Hooklying Clamshells with Resistance - 1 x daily - 7 x weekly - 3 sets - 10 reps Seated Thoracic Extension and Rotation with Reach - 1 x daily - 7 x weekly - 3 sets - 10 reps Standing Quadratus Lumborum Stretch with Doorway - 1 x daily - 7 x weekly - 3 sets - 10 reps  Patient Education Trigger Point Dry Needling

## 2020-06-19 ENCOUNTER — Ambulatory Visit: Payer: BC Managed Care – PPO | Admitting: Physical Therapy

## 2020-06-21 ENCOUNTER — Ambulatory Visit: Payer: BC Managed Care – PPO | Admitting: Physical Therapy

## 2020-06-28 ENCOUNTER — Ambulatory Visit: Payer: BC Managed Care – PPO | Attending: Obstetrics and Gynecology | Admitting: Physical Therapy

## 2020-06-28 ENCOUNTER — Other Ambulatory Visit: Payer: Self-pay

## 2020-06-28 DIAGNOSIS — G8929 Other chronic pain: Secondary | ICD-10-CM | POA: Diagnosis present

## 2020-06-28 DIAGNOSIS — M545 Low back pain, unspecified: Secondary | ICD-10-CM | POA: Insufficient documentation

## 2020-06-28 DIAGNOSIS — M6281 Muscle weakness (generalized): Secondary | ICD-10-CM | POA: Diagnosis present

## 2020-06-28 DIAGNOSIS — R293 Abnormal posture: Secondary | ICD-10-CM | POA: Diagnosis present

## 2020-06-28 NOTE — Therapy (Signed)
[facility] [address] [place], [place], [address] Phone: [phone]   Fax:  [phone]  Physical Therapy Treatment  Patient Details  Name: Dawn Zamora MRN: [id number] Date of Birth: 02-27-1991 Referring Provider (PT): Delsa Bern, MD   Encounter Date: 06/28/2020   PT End of Session - 06/28/20 1540    Visit Number 5    Date for PT Re-Evaluation 08/08/20    Authorization Type BCBS mcaid family planning    PT Start Time 1400    PT Stop Time 1443    PT Time Calculation (min) 43 min    Activity Tolerance Patient tolerated treatment well    Behavior During Therapy [facility] for tasks assessed/performed           Past Medical History:  Diagnosis Date  . Anemia   . Anxiety   . Asthma   . Dysrhythmia   . Generalized headaches   . Hx of chlamydia infection     Past Surgical History:  Procedure Laterality Date  . NO PAST SURGERIES      There were no vitals filed for this visit.   Subjective Assessment - 06/28/20 1419    Subjective Pt states she had no pain since last time other than 2x    How long can you walk comfortably? 20 minutes on a good day    Currently in Pain? No/denies                             [facility] Adult PT Treatment/Exercise - 06/28/20 0001      Self-Care   Other Self-Care Comments  demo of SI belt - feels better - tool for flare up      Neuro Re-ed    Neuro Re-ed Details  gluteal activation during functional movements - TC and VC      Exercises   Exercises Other Exercises    Other Exercises  sLR with and without pelvic compression      Lumbar Exercises: Standing   Side Lunge Limitations star reaches side, diagonal, back - 10x each side needing support to do on Rt side      Lumbar Exercises: Sidelying   Clam Right;20 reps      Lumbar Exercises: Prone   Straight Leg Raise 20 reps    Straight Leg Raises Limitations 10x      Lumbar Exercises: Quadruped    Other Quadruped Lumbar Exercises fire hydrand and donkey kicks - 10x each heavy TC to stabilize      Manual Therapy   Soft tissue mobilization gluteals Rt                    PT Short Term Goals - 06/05/20 1716      PT SHORT TERM GOAL #1   Title ind with intitial HEP    Status Achieved             PT Long Term Goals - 06/28/20 1426      PT LONG TERM GOAL #1   Title Pt will be able to participate in recreational activities such as walk or swim for at least 1 hour due to improved pelvic stability.    Status On-going      PT LONG TERM GOAL #3   Title Pt will have no difficulty with ASLR on either side to demonstrate improved pelvic stability    Baseline Lt side ASLR is hard; Rt no  problem      PT LONG TERM GOAL #4   Title Pt will report overall 60% less pain frequency and intensity    Status On-going                 Plan - 06/28/20 1534    Clinical Impression Statement Pt is porgressing well.  No pain after treatment today and has been walking more without pain which she hasn't experienced in a long time.  Pt did mostly strengthening today with focus on gluteals.  Pt demonstrates a lot of knee valgus during single leg when standing on the Rt LE.  Pt will benefit from skilled PT to improved functional movements and improve gluteal strength on the Rt side.    PT Treatment/Interventions ADLs/Self Care Home Management;Biofeedback;Moist Heat;Electrical Stimulation;Cryotherapy;Therapeutic activities;Therapeutic exercise;Balance training;Neuromuscular re-education;Patient/family education;Manual techniques;Taping;Dry needling;Passive range of motion    PT Next Visit Plan progress gluteal strength, monitor pain, f/u on walking routine; small step down    PT Home Exercise Plan Access Code: [email or web address]    Consulted and Agree with Plan of Care Patient           Patient will benefit from skilled therapeutic intervention in order to improve the following deficits and  impairments:  Pain, Postural dysfunction, Increased fascial restricitons, Decreased strength, Decreased coordination, Improper body mechanics, Decreased range of motion, Increased muscle spasms  Visit Diagnosis: Chronic left-sided low back pain without sciatica  Muscle weakness (generalized)  Abnormal posture     Problem List Patient Active Problem List   Diagnosis Date Noted  . Adjustment disorder with depressed mood 02/15/2017  . NSVD (normal spontaneous vaginal delivery) 01/07/2014  . Normal labor 01/06/2014    Jule Ser, PT 06/28/2020, 3:52 PM  [facility] [address] [place], [place], [address] Phone: [phone]   Fax:  [phone]  Name: Dawn Zamora MRN: [id number] Date of Birth: Feb 19, 1991

## 2020-07-04 ENCOUNTER — Encounter: Payer: Self-pay | Admitting: Physical Therapy

## 2020-07-04 ENCOUNTER — Other Ambulatory Visit: Payer: Self-pay

## 2020-07-04 ENCOUNTER — Ambulatory Visit: Payer: BC Managed Care – PPO | Admitting: Physical Therapy

## 2020-07-04 DIAGNOSIS — M6281 Muscle weakness (generalized): Secondary | ICD-10-CM

## 2020-07-04 DIAGNOSIS — M545 Low back pain, unspecified: Secondary | ICD-10-CM

## 2020-07-04 DIAGNOSIS — R293 Abnormal posture: Secondary | ICD-10-CM

## 2020-07-04 DIAGNOSIS — G8929 Other chronic pain: Secondary | ICD-10-CM

## 2020-07-04 NOTE — Patient Instructions (Signed)
Access Code: [email or web address] URL: [email or web address] Date: 07/04/2020 Prepared by: Jari Favre  Exercises Supine Posterior Pelvic Tilt - 1 x daily - 7 x weekly - 3 sets - 10 reps Supine Piriformis Stretch - 1 x daily - 7 x weekly - 3 reps - 1 sets - 30 sec hold Supine Butterfly Groin Stretch - 1 x daily - 7 x weekly - 3 reps - 1 sets - 30 sec hold Bent Knee Fallouts - 1 x daily - 7 x weekly - 1 sets - 10 reps Supine Lower Trunk Rotation - 1 x daily - 7 x weekly - 1 sets - 10 reps - 5 sec hold Sidelying Thoracic Rotation with Open Book - 1 x daily - 7 x weekly - 1 sets - 5 reps - 10 sec hold Supine Hip Adductor Squeeze with Small Ball - 1 x daily - 7 x weekly - 2 sets - 10 reps - 5 sec hold Hooklying Clamshells with Resistance - 1 x daily - 7 x weekly - 3 sets - 10 reps Seated Thoracic Extension and Rotation with Reach - 1 x daily - 7 x weekly - 3 sets - 10 reps Standing Quadratus Lumborum Stretch with Doorway - 1 x daily - 7 x weekly - 3 sets - 10 reps Half Kneeling Hip Flexor Stretch with Sidebend - 1 x daily - 7 x weekly - 3 sets - 10 reps Sidelying ITB Stretch off Table - 1 x daily - 7 x weekly - 3 sets - 10 reps Prone Hip Extension - One Pillow - 1 x daily - 7 x weekly - 3 sets - 10 reps Clamshell - 1 x daily - 7 x weekly - 3 sets - 10 reps Standing Anti-Rotation Press with Anchored Resistance - 1 x daily - 7 x weekly - 3 sets - 10 reps  Patient Education Trigger Point Dry Needling

## 2020-07-04 NOTE — Therapy (Signed)
[facility] [address], [place], [place], [address] Phone: [phone]   Fax:  [phone]  Physical Therapy Treatment  Patient Details  Name: Dawn Zamora MRN: [id number] Date of Birth: January 24, 1991 Referring Provider (PT): Delsa Bern, MD   Encounter Date: 07/04/2020   PT End of Session - 07/04/20 1451    Visit Number 6    Date for PT Re-Evaluation 08/08/20    Authorization Type BCBS mcaid family planning    PT Start Time 1450    PT Stop Time 1528    PT Time Calculation (min) 38 min           Past Medical History:  Diagnosis Date  . Anemia   . Anxiety   . Asthma   . Dysrhythmia   . Generalized headaches   . Hx of chlamydia infection     Past Surgical History:  Procedure Laterality Date  . NO PAST SURGERIES      There were no vitals filed for this visit.   Subjective Assessment - 07/04/20 1457    Subjective Overall 80-85% on the normal daily pain.  When it gets irritated it gets to 8-9/10.  It used to be 10/10 when irritated.    How long can you walk comfortably? 30 minutes    Currently in Pain? No/denies                             [facility] Adult PT Treatment/Exercise - 07/04/20 0001      Neuro Re-ed    Neuro Re-ed Details  mirror to cue posture      Exercises   Other Exercises  mirror cues to demonstrate posture; ribcage sidebending and rotations      Lumbar Exercises: Stretches   Other Lumbar Stretch Exercise hip flexor kneeling; IT band against wall with sidebend      Lumbar Exercises: Standing   Other Standing Lumbar Exercises step down 4"    Other Standing Lumbar Exercises band ext; anti-rotation; rotation - 10x each      Lumbar Exercises: Seated   Other Seated Lumbar Exercises circles and kicks on ball - 20x each                  PT Education - 07/04/20 1529    Education Details Access Code: [email or web address]    Person(s) Educated Patient    Methods  Explanation;Demonstration;Tactile cues;Verbal cues;Handout    Comprehension Verbalized understanding;Returned demonstration            PT Short Term Goals - 06/05/20 1716      PT SHORT TERM GOAL #1   Title ind with intitial HEP    Status Achieved             PT Long Term Goals - 07/04/20 1454      PT LONG TERM GOAL #1   Title Pt will be able to participate in recreational activities such as walk or swim for at least 1 hour due to improved pelvic stability.    Baseline 30 minutes    Status Partially Met      PT LONG TERM GOAL #2   Title Pt will have 5/5 hip abduction Lt LE for improved functional movement such as lifting and single leg standing without compensations      PT LONG TERM GOAL #4   Title Pt will report overall 60% less pain frequency and intensity    Baseline 80-85%  Status Achieved                 Plan - 07/04/20 1530    Clinical Impression Statement Pt is at least 80% better since starting PT.  Pt demonstrates posture abnormalities that she is able to correct with cues but with TC and visual cues using mirror as these patterns have been used habitually.  Pt was given exercises to workn on more balance in the core and she was able to demonstrate correcting her posture after education provided in today's session.  Pt will benefit from skilled PT to progress strength for imrpoved posture and function.    PT Treatment/Interventions ADLs/Self Care Home Management;Biofeedback;Moist Heat;Electrical Stimulation;Cryotherapy;Therapeutic activities;Therapeutic exercise;Balance training;Neuromuscular re-education;Patient/family education;Manual techniques;Taping;Dry needling;Passive range of motion    PT Next Visit Plan progress gluteal strength, monitor pain, f/u on walking routine; small step down; posture/core strength    PT Home Exercise Plan Access Code: [email or web address]    Consulted and Agree with Plan of Care Patient           Patient will benefit from skilled  therapeutic intervention in order to improve the following deficits and impairments:  Pain, Postural dysfunction, Increased fascial restricitons, Decreased strength, Decreased coordination, Improper body mechanics, Decreased range of motion, Increased muscle spasms  Visit Diagnosis: Chronic left-sided low back pain without sciatica  Muscle weakness (generalized)  Abnormal posture     Problem List Patient Active Problem List   Diagnosis Date Noted  . Adjustment disorder with depressed mood 02/15/2017  . NSVD (normal spontaneous vaginal delivery) 01/07/2014  . Normal labor 01/06/2014    Jule Ser, PT 07/04/2020, 5:16 PM  [facility] [address] [place], [place], [address] Phone: [phone]   Fax:  [phone]  Name: Dawn Zamora MRN: [id number] Date of Birth: 1991-06-14

## 2020-07-19 ENCOUNTER — Encounter: Payer: BC Managed Care – PPO | Admitting: Physical Therapy

## 2020-07-26 ENCOUNTER — Other Ambulatory Visit: Payer: Self-pay

## 2020-07-26 ENCOUNTER — Ambulatory Visit: Payer: BC Managed Care – PPO | Attending: Obstetrics and Gynecology | Admitting: Physical Therapy

## 2020-07-26 ENCOUNTER — Encounter: Payer: Self-pay | Admitting: Physical Therapy

## 2020-07-26 DIAGNOSIS — G8929 Other chronic pain: Secondary | ICD-10-CM | POA: Insufficient documentation

## 2020-07-26 DIAGNOSIS — M545 Low back pain, unspecified: Secondary | ICD-10-CM

## 2020-07-26 DIAGNOSIS — M6281 Muscle weakness (generalized): Secondary | ICD-10-CM | POA: Diagnosis present

## 2020-07-26 DIAGNOSIS — R293 Abnormal posture: Secondary | ICD-10-CM | POA: Insufficient documentation

## 2020-07-28 NOTE — Therapy (Addendum)
[facility] [address] [place], [place], [address] Phone: [phone]   Fax:  [phone]  Physical Therapy Treatment  Patient Details  Name: Dawn Zamora MRN: [id number] Date of Birth: 04-Jun-1991 Referring Provider (PT): Delsa Bern, MD   Encounter Date: 07/26/2020   PT End of Session - 07/28/20 1340    Visit Number 7    Date for PT Re-Evaluation 08/08/20    Authorization Type BCBS mcaid family planning    PT Start Time 1535    PT Stop Time 1616    PT Time Calculation (min) 41 min    Activity Tolerance Patient tolerated treatment well    Behavior During Therapy [facility] for tasks assessed/performed           Past Medical History:  Diagnosis Date  . Anemia   . Anxiety   . Asthma   . Dysrhythmia   . Generalized headaches   . Hx of chlamydia infection     Past Surgical History:  Procedure Laterality Date  . NO PAST SURGERIES      There were no vitals filed for this visit.   Subjective Assessment - 07/28/20 1355    Subjective Pt states there were a few times it hurt since last time.  No pain currently.  It hurt after sleeping on it and then one time after doing something but cannot remember.    Limitations Walking    How long can you walk comfortably? 30 minutes    Currently in Pain? No/denies                             [facility] Adult PT Treatment/Exercise - 07/28/20 0001      Lumbar Exercises: Stretches   Other Lumbar Stretch Exercise thoracic side bend bilat; thoracic roation in flexion and ext - both in seated - 3 x 20 sec - cues for breathing during stretch and exhale into more movement      Lumbar Exercises: Standing   Functional Squats 15 reps    Functional Squats Limitations minimal compensations; needs some cues to    Other Standing Lumbar Exercises step down 4" - 2x10 bilat   able to do with min instability and was looking even on both sides     Manual Therapy    Manual Therapy Myofascial release    Myofascial Release bilateral lower thoracic into upper Rt quadrant fascial release in all 6 planes                    PT Short Term Goals - 06/05/20 1716      PT SHORT TERM GOAL #1   Title ind with intitial HEP    Status Achieved             PT Long Term Goals - 07/04/20 1454      PT LONG TERM GOAL #1   Title Pt will be able to participate in recreational activities such as walk or swim for at least 1 hour due to improved pelvic stability.    Baseline 30 minutes    Status Partially Met      PT LONG TERM GOAL #2   Title Pt will have 5/5 hip abduction Lt LE for improved functional movement such as lifting and single leg standing without compensations      PT LONG TERM GOAL #4   Title Pt will report overall 60% less pain frequency and intensity  Baseline 80-85%    Status Achieved                 Plan - 07/28/20 1342    Clinical Impression Statement Pt had several flare ups since previous visit but did not last as long as would have prior to PT . Pt demonstrates some thoracic rotation to the Rt and sidebend to the Rt when standing.  Pt responded well to myofascial release to Rt lower thoracic region  with minimal posture deviations after treatment and reports feeling more even on her feet.  Pt was given stretches to maintain improved alignment.  She is looking much stronger in the Rt hip with single leg step down.  Pt will benefit from skilled PT for continued progress towards functional goals.    PT Treatment/Interventions ADLs/Self Care Home Management;Biofeedback;Moist Heat;Electrical Stimulation;Cryotherapy;Therapeutic activities;Therapeutic exercise;Balance training;Neuromuscular re-education;Patient/family education;Manual techniques;Taping;Dry needling;Passive range of motion    PT Next Visit Plan progress gluteal strength, monitor pain, f/u on thoracic mobs added; single leg and step down; posture/core strength; diagonals     PT Home Exercise Plan Access Code: [email or web address]    Consulted and Agree with Plan of Care Patient           Patient will benefit from skilled therapeutic intervention in order to improve the following deficits and impairments:  Pain,Postural dysfunction,Increased fascial restricitons,Decreased strength,Decreased coordination,Improper body mechanics,Decreased range of motion,Increased muscle spasms  Visit Diagnosis: Chronic left-sided low back pain without sciatica  Muscle weakness (generalized)  Abnormal posture     Problem List Patient Active Problem List   Diagnosis Date Noted  . Adjustment disorder with depressed mood 02/15/2017  . NSVD (normal spontaneous vaginal delivery) 01/07/2014  . Normal labor 01/06/2014    Jule Ser , PT 07/28/2020, 1:55 PM  [facility] [address] [place], [place], [address] Phone: [phone]   Fax:  [phone]  Name: Dawn Zamora MRN: [id number] Date of Birth: 11-27-1990

## 2020-08-02 ENCOUNTER — Other Ambulatory Visit: Payer: Self-pay

## 2020-08-02 ENCOUNTER — Ambulatory Visit: Payer: BC Managed Care – PPO | Admitting: Physical Therapy

## 2020-08-02 DIAGNOSIS — R293 Abnormal posture: Secondary | ICD-10-CM

## 2020-08-02 DIAGNOSIS — M6281 Muscle weakness (generalized): Secondary | ICD-10-CM

## 2020-08-02 DIAGNOSIS — G8929 Other chronic pain: Secondary | ICD-10-CM

## 2020-08-02 DIAGNOSIS — M545 Low back pain, unspecified: Secondary | ICD-10-CM

## 2020-08-02 NOTE — Therapy (Addendum)
[facility] [address], [place], [place], [address] Phone: [phone]   Fax:  [phone]  Physical Therapy Treatment  Patient Details  Name: Dawn Zamora MRN: [id number] Date of Birth: 1990/11/19 Referring Provider (PT): Delsa Bern, MD   Encounter Date: 08/02/2020   PT End of Session - 08/02/20 1718    Visit Number 8    Date for PT Re-Evaluation 08/08/20    Authorization Type [employer]BS mcaid family planning    PT Start Time 1530    PT Stop Time 1611    PT Time Calculation (min) 41 min    Activity Tolerance Patient tolerated treatment well    Behavior During Therapy [facility] for tasks assessed/performed           Past Medical History:  Diagnosis Date  . Anemia   . Anxiety   . Asthma   . Dysrhythmia   . Generalized headaches   . Hx of chlamydia infection     Past Surgical History:  Procedure Laterality Date  . NO PAST SURGERIES      There were no vitals filed for this visit.   Subjective Assessment - 08/02/20 1535    Subjective I had a few days when I had pain and I don't know why.    Currently in Pain? No/denies                             [facility] Adult PT Treatment/Exercise - 08/05/20 0001      Neuro Re-ed    Neuro Re-ed Details  MET to rotate Rt ilium back - educated and demo and performed to add to HEP; glute activation      Lumbar Exercises: Aerobic   Nustep L2 x 8 min PT present to get status      Lumbar Exercises: Standing   Functional Squats 15 reps    Functional Squats Limitations minimal compensations; needs some cues to    Other Standing Lumbar Exercises step down 4" - 2x10 bilat   able to do with min instability and was looking even on both sides   Other Standing Lumbar Exercises walkking with sports cord 4 way x 8 reps - 25#      Lumbar Exercises: Supine   Other Supine Lumbar Exercises foam roller lumbar stretches; lengthwise unable to tolerate due to pain                     PT Short Term Goals - 06/05/20 1716      PT SHORT TERM GOAL #1   Title ind with intitial HEP    Status Achieved             PT Long Term Goals - 07/04/20 1454      PT LONG TERM GOAL #1   Title Pt will be able to participate in recreational activities such as walk or swim for at least 1 hour due to improved pelvic stability.    Baseline 30 minutes    Status Partially Met      PT LONG TERM GOAL #2   Title Pt will have 5/5 hip abduction Lt LE for improved functional movement such as lifting and single leg standing without compensations      PT LONG TERM GOAL #4   Title Pt will report overall 60% less pain frequency and intensity    Baseline 80-85%    Status Achieved  Plan - 08/05/20 1727    Clinical Impression Statement Pt did well witih additional exercises.  Pt was feeling some pain in her Rt SI when lying on the soft foam roller lengthways.  PT observed posterior rotatoin of the Rt ilium and was corrected with MET.  Pt able to do this and added to HEP.  Pt will benefit fom skilled PT to continue working on core strength, posture and pelvic stability    PT Treatment/Interventions ADLs/Self Care Home Management;Biofeedback;Moist Heat;Electrical Stimulation;Cryotherapy;Therapeutic activities;Therapeutic exercise;Balance training;Neuromuscular re-education;Patient/family education;Manual techniques;Taping;Dry needling;Passive range of motion    PT Next Visit Plan progress gluteal strength, f/u on SI alignment; monitor pain; single leg and step down; posture/core strength; diagonals    PT Home Exercise Plan Access Code: [email or web address]    Consulted and Agree with Plan of Care Patient           Patient will benefit from skilled therapeutic intervention in order to improve the following deficits and impairments:  Pain,Postural dysfunction,Increased fascial restricitons,Decreased strength,Decreased coordination,Improper body mechanics,Decreased  range of motion,Increased muscle spasms  Visit Diagnosis: Chronic left-sided low back pain without sciatica  Muscle weakness (generalized)  Abnormal posture     Problem List Patient Active Problem List   Diagnosis Date Noted  . Adjustment disorder with depressed mood 02/15/2017  . NSVD (normal spontaneous vaginal delivery) 01/07/2014  . Normal labor 01/06/2014    Jule Ser, PT 08/05/2020, 5:40 PM  [facility] [address] [place], [place], [address] Phone: [phone]   Fax:  [phone]  Name: Dawn Zamora MRN: [id number] Date of Birth: 02-21-1991

## 2020-08-08 ENCOUNTER — Encounter: Payer: Self-pay | Admitting: Physical Therapy

## 2020-08-08 ENCOUNTER — Ambulatory Visit: Payer: BC Managed Care – PPO | Admitting: Physical Therapy

## 2020-08-08 ENCOUNTER — Other Ambulatory Visit: Payer: Self-pay

## 2020-08-08 DIAGNOSIS — M545 Low back pain, unspecified: Secondary | ICD-10-CM

## 2020-08-08 DIAGNOSIS — R293 Abnormal posture: Secondary | ICD-10-CM

## 2020-08-08 DIAGNOSIS — M6281 Muscle weakness (generalized): Secondary | ICD-10-CM

## 2020-08-08 NOTE — Therapy (Addendum)
[facility] [address] [place], [place], [address] Phone: [phone]   Fax:  [phone]  Physical Therapy Treatment  Patient Details  Name: Dawn Zamora MRN: [id number] Date of Birth: [birth year] Referring Provider (PT): Delsa Bern, MD   Encounter Date: 08/08/2020   PT End of Session - 08/08/20 1506    Visit Number 9    Date for PT Re-Evaluation 08/08/20    Authorization Type [employer]BS mcaid family planning    PT Start Time 1448    PT Stop Time 1528    PT Time Calculation (min) 40 min    Activity Tolerance Patient tolerated treatment well    Behavior During Therapy [facility] for tasks assessed/performed           Past Medical History:  Diagnosis Date  . Anemia   . Anxiety   . Asthma   . Dysrhythmia   . Generalized headaches   . Hx of chlamydia infection     Past Surgical History:  Procedure Laterality Date  . NO PAST SURGERIES      There were no vitals filed for this visit.   Subjective Assessment - 08/08/20 1448    Subjective I didn't have any flare ups since last time.  I thought I was going to be sore but I wasn't.    Currently in Pain? No/denies                             [facility] Adult PT Treatment/Exercise - 08/08/20 0001      Lumbar Exercises: Stretches   Active Hamstring Stretch Limitations hip flexor stretch half kneel      Lumbar Exercises: Machines for Strengthening   Leg Press 40 lb single leg      Lumbar Exercises: Standing   Other Standing Lumbar Exercises side step blue loop      Lumbar Exercises: Sidelying   Hip Abduction Right;10 reps      Manual Therapy   Myofascial Release Rt lower quadrant           nustep L3 - reviewing goals and HEP with PT         PT Short Term Goals - 06/05/20 1716      PT SHORT TERM GOAL #1   Title ind with intitial HEP    Status Achieved             PT Long Term Goals - 08/08/20 1652      PT LONG TERM GOAL  #1   Title Pt will be able to participate in recreational activities such as walk or swim for at least 1 hour due to improved pelvic stability.    Baseline 30 minutes; hasn't been walking due to weather    Status Partially Met      PT LONG TERM GOAL #2   Title Pt will have 5/5 hip abduction Lt LE for improved functional movement such as lifting and single leg standing without compensations    Baseline 4+/5 MMT Lt hip abduction    Status Partially Met      PT LONG TERM GOAL #3   Title Pt will have no difficulty with ASLR on either side to demonstrate improved pelvic stability    Status Achieved      PT LONG TERM GOAL #4   Title Pt will report overall 60% less pain frequency and intensity    Baseline 80-85%    Status Achieved  Plan - 08/08/20 1651    Clinical Impression Statement Pt has maintain pelvic alignment.  Pt is doing well with exercises.  She still exhibits weakness but is aware of how to continue strengthening and will be recommended to d/c with HEP today.    PT Treatment/Interventions ADLs/Self Care Home Management;Biofeedback;Moist Heat;Electrical Stimulation;Cryotherapy;Therapeutic activities;Therapeutic exercise;Balance training;Neuromuscular re-education;Patient/family education;Manual techniques;Taping;Dry needling;Passive range of motion    PT Next Visit Plan d/c    PT Home Exercise Plan Access Code: [email or web address]    Consulted and Agree with Plan of Care Patient           Patient will benefit from skilled therapeutic intervention in order to improve the following deficits and impairments:  Pain,Postural dysfunction,Increased fascial restricitons,Decreased strength,Decreased coordination,Improper body mechanics,Decreased range of motion,Increased muscle spasms  Visit Diagnosis: Chronic left-sided low back pain without sciatica  Muscle weakness (generalized)  Abnormal posture     Problem List Patient Active Problem List   Diagnosis Date  Noted  . Adjustment disorder with depressed mood 02/15/2017  . NSVD (normal spontaneous vaginal delivery) 01/07/2014  . Normal labor 01/06/2014    Jule Ser, PT 08/08/2020, 4:55 PM  [facility] [address] [place], [place], [address] Phone: [phone]   Fax:  [phone]  Name: Dawn Zamora MRN: [id number] Date of Birth: 11-24-1990  PHYSICAL THERAPY DISCHARGE SUMMARY  Visits from Start of Care: 9  Current functional level related to goals / functional outcomes: See above goals   Remaining deficits: See above   Education / Equipment: HEP Plan: Patient agrees to discharge.  Patient goals were partially met. Patient is being discharged due to being pleased with the current functional level.  ?????    [employer], PT 08/08/20 4:58 PM

## 2020-08-16 ENCOUNTER — Encounter: Payer: BC Managed Care – PPO | Admitting: Physical Therapy

## 2020-10-07 ENCOUNTER — Other Ambulatory Visit: Payer: Self-pay

## 2020-10-07 ENCOUNTER — Emergency Department (HOSPITAL_COMMUNITY)
Admission: EM | Admit: 2020-10-07 | Discharge: 2020-10-07 | Disposition: A | Discharge status: Home / Self Care | Payer: BC Managed Care – PPO | Attending: Emergency Medicine | Admitting: Emergency Medicine

## 2020-10-07 ENCOUNTER — Encounter (HOSPITAL_COMMUNITY): Payer: Self-pay | Admitting: Emergency Medicine

## 2020-10-07 ENCOUNTER — Emergency Department (HOSPITAL_COMMUNITY): Payer: BC Managed Care – PPO

## 2020-10-07 ENCOUNTER — Other Ambulatory Visit (HOSPITAL_COMMUNITY): Payer: Medicaid Other

## 2020-10-07 DIAGNOSIS — R1031 Right lower quadrant pain: Secondary | ICD-10-CM | POA: Insufficient documentation

## 2020-10-07 DIAGNOSIS — Z87891 Personal history of nicotine dependence: Secondary | ICD-10-CM | POA: Insufficient documentation

## 2020-10-07 DIAGNOSIS — J45909 Unspecified asthma, uncomplicated: Secondary | ICD-10-CM | POA: Insufficient documentation

## 2020-10-07 DIAGNOSIS — R110 Nausea: Secondary | ICD-10-CM | POA: Insufficient documentation

## 2020-10-07 LAB — CBC
HCT: 37.2 % (ref 36.0–46.0)
Hemoglobin: 12 g/dL (ref 12.0–15.0)
MCH: 27.1 pg (ref 26.0–34.0)
MCHC: 32.3 g/dL (ref 30.0–36.0)
MCV: 84.2 fL (ref 80.0–100.0)
Platelets: 289 10*3/uL (ref 150–400)
RBC: 4.42 MIL/uL (ref 3.87–5.11)
RDW: 13.9 % (ref 11.5–15.5)
WBC: 5.4 10*3/uL (ref 4.0–10.5)
nRBC: 0 % (ref 0.0–0.2)

## 2020-10-07 LAB — LIPASE, BLOOD: Lipase: 42 U/L (ref 11–51)

## 2020-10-07 LAB — URINALYSIS, ROUTINE W REFLEX MICROSCOPIC
Bilirubin Urine: NEGATIVE
Glucose, UA: NEGATIVE mg/dL
Hgb urine dipstick: NEGATIVE
Ketones, ur: NEGATIVE mg/dL
Nitrite: NEGATIVE
Protein, ur: NEGATIVE mg/dL
Specific Gravity, Urine: 1.026 (ref 1.005–1.030)
pH: 5 (ref 5.0–8.0)

## 2020-10-07 LAB — COMPREHENSIVE METABOLIC PANEL
ALT: 14 U/L (ref 0–44)
AST: 16 U/L (ref 15–41)
Albumin: 4 g/dL (ref 3.5–5.0)
Alkaline Phosphatase: 51 U/L (ref 38–126)
Anion gap: 11 (ref 5–15)
BUN: 9 mg/dL (ref 6–20)
CO2: 26 mmol/L (ref 22–32)
Calcium: 9.3 mg/dL (ref 8.9–10.3)
Chloride: 101 mmol/L (ref 98–111)
Creatinine, Ser: 0.83 mg/dL (ref 0.44–1.00)
GFR, Estimated: >60 mL/min (ref >60)
Glucose, Bld: 94 mg/dL (ref 70–99)
Potassium: 3.6 mmol/L (ref 3.5–5.1)
Sodium: 138 mmol/L (ref 135–145)
Total Bilirubin: 0.5 mg/dL (ref 0.3–1.2)
Total Protein: 7.1 g/dL (ref 6.5–8.1)

## 2020-10-07 LAB — I-STAT BETA HCG BLOOD, ED (MC, WL, AP ONLY): I-stat hCG, quantitative: <5 m[IU]/mL (ref <5)

## 2020-10-07 MED ORDER — IOHEXOL 300 MG/ML  SOLN
99.0000 mL | Freq: Once | INTRAMUSCULAR | Status: AC | PRN
  Administered 2020-10-07: 99 mL via INTRAVENOUS

## 2020-10-07 MED ORDER — ONDANSETRON 4 MG PO TBDP
4.0000 mg | ORAL_TABLET | Freq: Once | ORAL | Status: AC
  Administered 2020-10-07: 4 mg via ORAL
  Filled 2020-10-07: qty 1

## 2020-10-07 MED ORDER — MORPHINE SULFATE (PF) 4 MG/ML IV SOLN
4.0000 mg | Freq: Once | INTRAVENOUS | Status: AC
  Administered 2020-10-07: 4 mg via INTRAVENOUS
  Filled 2020-10-07: qty 1

## 2020-10-07 MED ORDER — SODIUM CHLORIDE 0.9 % IV BOLUS
1000.0000 mL | Freq: Once | INTRAVENOUS | Status: AC
  Administered 2020-10-07: 1000 mL via INTRAVENOUS

## 2020-10-07 NOTE — Discharge Instructions (Addendum)
At this time there does not appear to be the presence of an emergent medical condition, however there is always the potential for conditions to change. Please read and follow the below instructions.  Please return to the Emergency Department immediately for any new or worsening symptoms. Please be sure to follow up with your Primary Care Provider within one week regarding your visit today; please call their office to schedule an appointment even if you are feeling better for a follow-up visit. Call your OB/GYN tomorrow morning to schedule follow-up appointment. Your ultrasound today showed heterogeneous uterus which is possibly due to adenomyosis. It also showed polycystic morphology of your left ovary. Please discuss these incidental findings with your OB/GYN at your follow-up visit. Your CT scan today showed a right adnexal cyst, please discuss this with your OB/GYN and PCP at your follow-up appointment  Go to the nearest Emergency Department immediately if: You have fever or chills You cannot stop vomiting. Your pain is only in areas of your belly, such as the right side or the left lower part of the belly. You have bloody or black poop, or poop that looks like tar. You have very bad pain, cramping, or bloating in your belly. You have signs of not having enough fluid or water in your body (dehydration), such as: Dark pee, very little pee, or no pee. Cracked lips. Dry mouth. Sunken eyes. Sleepiness. Weakness. You have trouble breathing or chest pain. You have any new/concerning or worsening of symptoms.   Please read the additional information packets attached to your discharge summary.  Do not take your medicine if  develop an itchy rash, swelling in your mouth or lips, or difficulty breathing; call 911 and seek immediate emergency medical attention if this occurs.  You may review your lab tests and imaging results in their entirety on your MyChart account.  Please discuss all results  of fully with your primary care provider and other specialist at your follow-up visit.  Note: Portions of this text may have been transcribed using voice recognition software. Every effort was made to ensure accuracy; however, inadvertent computerized transcription errors may still be present.

## 2020-10-07 NOTE — ED Triage Notes (Signed)
Pt reports intermittent RLQ pain since this morning.  States she normally has nausea in the mornings but didn't today.  LMP 12/22.

## 2020-10-07 NOTE — ED Notes (Signed)
Patient transported to Ultrasound 

## 2020-10-07 NOTE — ED Notes (Signed)
Patient transported to CT 

## 2020-10-07 NOTE — ED Provider Notes (Cosign Needed Addendum)
[facility] EMERGENCY DEPARTMENT Provider Note   CSN: [id number] Arrival date & time: 10/07/20  1119     History Chief Complaint  Patient presents with  . Abdominal Pain    Dawn Zamora is a 30 y.o. female presents today for right lower quadrant abdominal pain onset 9 AM this morning pain is sharp intermittent no clear aggravating or alleviating factors, no radiation of pain denies similar in the past.  Reports associated nausea without vomiting.  Denies fever/chills, headache, chest pain/shortness of breath, cough, vomiting, diarrhea, dysuria/hematuria, vaginal bleeding/discharge, concern for STI or any additional concerns.  HPI     Past Medical History:  Diagnosis Date  . Anemia   . Anxiety   . Asthma   . Dysrhythmia   . Generalized headaches   . Hx of chlamydia infection     Patient Active Problem List   Diagnosis Date Noted  . Adjustment disorder with depressed mood 02/15/2017  . NSVD (normal spontaneous vaginal delivery) 01/07/2014  . Normal labor 01/06/2014    Past Surgical History:  Procedure Laterality Date  . NO PAST SURGERIES       OB History    Gravida  2   Para  1   Term  1   Preterm      AB  1   Living  1     SAB      IAB  1   Ectopic      Multiple      Live Births  1           Family History  Problem Relation Age of Onset  . Polydactyly Paternal Uncle   . Sickle cell anemia Cousin   . Diabetes Mother   . Diabetes Maternal Aunt   . Diabetes Maternal Grandmother   . Diabetes Maternal Grandfather     Social History   Tobacco Use  . Smoking status: Former Smoker    Quit date: 10/20/2010    Years since quitting: 9.9  . Smokeless tobacco: Never Used  Vaping Use  . Vaping Use: Never used  Substance Use Topics  . Alcohol use: Not Currently  . Drug use: No    Home Medications Prior to Admission medications   Medication Sig Start Date End Date Taking? Authorizing Provider  Biotin 1000 MCG CHEW  Chew 1 tablet by mouth daily.   Yes [provider]  Multiple Vitamins-Minerals (MULTIVITAMIN ADULT) CHEW Chew 2 tablets by mouth daily.   Yes [provider]  hydrOXYzine (ATARAX/VISTARIL) 25 MG tablet Take 1 tablet (25 mg total) by mouth 3 (three) times daily as needed for anxiety. Patient not taking: No sig reported 02/15/17   Patrecia Pour, NP  ibuprofen (ADVIL) 600 MG tablet Take 1 tablet (600 mg total) by mouth every 6 (six) hours as needed. Patient not taking: No sig reported 07/25/19   Jacqlyn Larsen, PA-C  methocarbamol (ROBAXIN) 500 MG tablet Take 1 tablet (500 mg total) by mouth 2 (two) times daily. Patient not taking: No sig reported 07/25/19   Jacqlyn Larsen, PA-C  naproxen (NAPROSYN) 500 MG tablet Take 1 tablet (500 mg total) by mouth 2 (two) times daily. Patient not taking: No sig reported 06/30/19   Jaynee Eagles, PA-C  norethindrone (AYGESTIN) 5 MG tablet Take 1 tablet (5 mg total) by mouth daily. Patient not taking: No sig reported 08/25/18   Ward, Ozella Almond, PA-C  traZODone (DESYREL) 50 MG tablet Take 1 tablet (50 mg total) by  mouth at bedtime as needed for sleep. Patient not taking: No sig reported 02/15/17   Patrecia Pour, NP    Allergies    Aspirin, Coconut oil, Lemon oil, Lemon balm [melissa officinalis], Metronidazole, Copper-containing compounds, and Nickel  Review of Systems   Review of Systems Ten systems are reviewed and are negative for acute change except as noted in the HPI  Physical Exam Updated Vital Signs BP 107/67   Pulse 77   Temp 98.6 F (37 C)   Resp 17   SpO2 99%   Physical Exam Constitutional:      General: She is not in acute distress.    Appearance: Normal appearance. She is well-developed. She is not ill-appearing or diaphoretic.  HENT:     Head: Normocephalic and atraumatic.  Eyes:     General: Vision grossly intact. Gaze aligned appropriately.     Pupils: Pupils are equal, round, and reactive to light.  Neck:      Trachea: Trachea and phonation normal.  Pulmonary:     Effort: Pulmonary effort is normal. No respiratory distress.  Abdominal:     General: There is no distension.     Palpations: Abdomen is soft.     Tenderness: There is abdominal tenderness in the right lower quadrant. There is no guarding or rebound. Positive signs include Rovsing's sign and McBurney's sign. Negative signs include Murphy's sign.  Genitourinary:    Comments: Pelvic exam refused by patient Musculoskeletal:        General: Normal range of motion.     Cervical back: Normal range of motion.  Skin:    General: Skin is warm and dry.  Neurological:     Mental Status: She is alert.     GCS: GCS eye subscore is 4. GCS verbal subscore is 5. GCS motor subscore is 6.     Comments: Speech is clear and goal oriented, follows commands Major Cranial nerves without deficit, no facial droop Moves extremities without ataxia, coordination intact  Psychiatric:        Behavior: Behavior normal.     ED Results / Procedures / Treatments   Labs (all labs ordered are listed, but only abnormal results are displayed) Labs Reviewed  URINALYSIS, ROUTINE W REFLEX MICROSCOPIC - Abnormal; Notable for the following components:      Result Value   APPearance CLOUDY (*)    Leukocytes,Ua LARGE (*)    Bacteria, UA RARE (*)    All other components within normal limits  LIPASE, BLOOD  COMPREHENSIVE METABOLIC PANEL  CBC  I-STAT BETA HCG BLOOD, ED ([facility], [facility], [facility] ONLY)    EKG None  Radiology CT ABDOMEN PELVIS W CONTRAST  Result Date: 10/07/2020 CLINICAL DATA:  Right lower quadrant pain and nausea. EXAM: CT ABDOMEN AND PELVIS WITH CONTRAST TECHNIQUE: Multidetector CT imaging of the abdomen and pelvis was performed using the standard protocol following bolus administration of intravenous contrast. CONTRAST:  18mL OMNIPAQUE IOHEXOL 300 MG/ML  SOLN COMPARISON:  None. FINDINGS: Lower chest: No acute abnormality. Hepatobiliary: No focal liver  abnormality is seen. No gallstones, gallbladder wall thickening, or biliary dilatation. Pancreas: Unremarkable. No pancreatic ductal dilatation or surrounding inflammatory changes. Spleen: Normal in size without focal abnormality. Adrenals/Urinary Tract: Adrenal glands are unremarkable. Kidneys are normal, without renal calculi, focal lesion, or hydronephrosis. Bladder is unremarkable. Stomach/Bowel: Stomach is within normal limits. Appendix appears normal. No evidence of bowel wall thickening, distention, or inflammatory changes. Vascular/Lymphatic: No significant vascular findings are present. No enlarged abdominal or pelvic  lymph nodes. Reproductive: The uterus is positioned to the right of midline and is otherwise normal in appearance. A 2.2 cm x 1.3 cm right adnexal cyst is noted (axial CT image 64, CT series number 3). Other: No abdominal wall hernia or abnormality. No abdominopelvic ascites. Musculoskeletal: No acute or significant osseous findings. IMPRESSION: Right adnexal cyst, likely ovarian in origin. Given the patient's age, no additional follow-up imaging is recommended. Reference: JACR 2020 Feb; 17(2):248-254 Electronically Signed   By: Virgina Norfolk M.D.   On: 10/07/2020 19:24   US PELVIC COMPLETE W TRANSVAGINAL AND TORSION R/O  Result Date: 10/07/2020 CLINICAL DATA:  30 year old female with right lower quadrant abdominal pain. EXAM: TRANSABDOMINAL AND TRANSVAGINAL ULTRASOUND OF PELVIS DOPPLER ULTRASOUND OF OVARIES TECHNIQUE: Both transabdominal and transvaginal ultrasound examinations of the pelvis were performed. Transabdominal technique was performed for global imaging of the pelvis including uterus, ovaries, adnexal regions, and pelvic cul-de-sac. It was necessary to proceed with endovaginal exam following the transabdominal exam to visualize the endometrium and ovaries. Color and duplex Doppler ultrasound was utilized to evaluate blood flow to the ovaries. COMPARISON:  CT abdomen pelvis  dated 10/07/2020. FINDINGS: Uterus Measurements: 8.4 x 3.2 x 5.1 cm = volume: 71 mL. The uterus is anteverted. There is slight heterogeneity of the myometrium with findings suggestive of adenomyosis. Endometrium Thickness: 10 mm.  No focal abnormality visualized. Right ovary Measurements: 4.0 x 1.9 x 3.3 cm = volume: 13.1 mL. There is a 2 cm dominant follicle. Left ovary Measurements: 3.2 x 2.1 x 2.1 cm = volume: 7.7 mL. Positive stick morphology with multiple uniform size peripherally located follicles. Pulsed Doppler evaluation of both ovaries demonstrates normal low-resistance arterial and venous waveforms. Other findings No abnormal free fluid. IMPRESSION: 1. Heterogeneous uterus with findings of possible adenomyosis. 2. Unremarkable endometrium. 3. Somewhat polycystic morphology of the left ovary. 4. Bilateral ovarian Doppler detected flow. Electronically Signed   By: Anner Crete M.D.   On: 10/07/2020 22:07    Procedures Procedures   Medications Ordered in ED Medications  sodium chloride 0.9 % bolus 1,000 mL (0 mLs Intravenous Stopped 10/07/20 2057)  morphine 4 MG/ML injection 4 mg (4 mg Intravenous Given 10/07/20 1837)  iohexol (OMNIPAQUE) 300 MG/ML solution 99 mL (99 mLs Intravenous Contrast Given 10/07/20 1905)    ED Course  I have reviewed the triage vital signs and the nursing notes.  Pertinent labs & imaging results that were available during my care of the patient were reviewed by me and considered in my medical decision making (see chart for details).    MDM Rules/Calculators/A&P                         Additional history obtained from: 1. Nursing notes from this visit. 2. Review of EMR. --- 30 year old female presented for right lower quadrant abdominal pain nausea that began this morning.  Labs were obtained in triage and I have reviewed them below.  CBC shows no leukocytosis or anemia.  Pregnancy test is negative.  Urinalysis appears contaminated, she has no urinary  symptoms, doubt UTI.  CMP is within normal limits.  Lipase is normal limits.  On exam she is focally tender in the right lower quadrant with a positive McBurney's point tenderness and Rovsing sign.  Shared decision making made with patient discussed watchful waiting versus CT imaging, patient is anxious and would like further evaluation will obtain CT abdomen pelvis to rule out appendicitis today. - CT AP:  IMPRESSION:  Right adnexal cyst, likely ovarian in origin. Given the patient's  age, no additional follow-up imaging is recommended. Reference: JACR  2020 Feb; 17(2):248-254   Pelvic US w/ Doppler:  IMPRESSION:  1. Heterogeneous uterus with findings of possible adenomyosis.  2. Unremarkable endometrium.  3. Somewhat polycystic morphology of the left ovary.  4. Bilateral ovarian Doppler detected flow.  - Patient reassessed she is resting comfortably in bed no acute distress vital signs stable. Patient symptoms may be secondary to menstrual cycle, adnexal cyst and adenomyosis there is no evidence for emergent findings such as appendicitis, torsion, TOA, kidney stone or other emergent pathologies at this time. Patient states understanding limitations of work-up today without pelvic exam and given her lack of vaginal discharge or concern for STI low suspicion for PID at this time. Plan of care is to have patient follow-up closely with her OB/GYN and PCP. Encouraged water hydration OTC anti-inflammatories  At this time there does not appear to be any evidence of an acute emergency medical condition and the patient appears stable for discharge with appropriate outpatient follow up. Diagnosis was discussed with patient who verbalizes understanding of care plan and is agreeable to discharge. I have discussed return precautions with patient who verbalizes understanding. Patient encouraged to follow-up with their PCP and OB/GYN. All questions answered.  Patient's case discussed with Dr. Alvino Chapel who  agrees with plan. - At discharge patient reported some nausea after receiving morphine.  She was given ODT Zofran with improvement.  Note: Portions of this report may have been transcribed using voice recognition software. Every effort was made to ensure accuracy; however, inadvertent computerized transcription errors may still be present. Final Clinical Impression(s) / ED Diagnoses Final diagnoses:  Right lower quadrant abdominal pain    Rx / DC Orders ED Discharge Orders    None       Gari Crown 10/07/20 2221    Gari Crown 10/07/20 2247    Davonna Belling, MD 10/08/20 [phone]

## 2020-10-10 LAB — URINE CULTURE: Culture: >=100000 — AB

## 2020-10-11 NOTE — Progress Notes (Signed)
ED Antimicrobial Stewardship Positive Culture Follow Up   Dawn Zamora is an 30 y.o. female who presented to [facility] on 10/07/2020 with a chief complaint of  Chief Complaint  Patient presents with  . Abdominal Pain    Recent Results (from the past 720 hour(s))  Urine culture     Status: Abnormal   Collection Time: 10/07/20 10:50 PM   Specimen: Urine, Random  Result Value Ref Range Status   Specimen Description URINE, RANDOM  Final   Special Requests   Final    ADDED 2250 Performed at [facility], [facility] [address], [place], [place] [address]    Culture >=100,000 COLONIES/mL ESCHERICHIA COLI (A)  Final   Report Status 10/10/2020 FINAL  Final   Organism ID, Bacteria ESCHERICHIA COLI (A)  Final      Susceptibility   Escherichia coli - MIC*    AMPICILLIN 4 SENSITIVE Sensitive     CEFAZOLIN <=4 SENSITIVE Sensitive     CEFEPIME <=0.12 SENSITIVE Sensitive     CEFTRIAXONE <=0.25 SENSITIVE Sensitive     CIPROFLOXACIN <=0.25 SENSITIVE Sensitive     GENTAMICIN <=1 SENSITIVE Sensitive     IMIPENEM <=0.25 SENSITIVE Sensitive     NITROFURANTOIN <=16 SENSITIVE Sensitive     TRIMETH/SULFA <=20 SENSITIVE Sensitive     AMPICILLIN/SULBACTAM <=2 SENSITIVE Sensitive     PIP/TAZO <=4 SENSITIVE Sensitive     * >=100,000 COLONIES/mL ESCHERICHIA COLI    Patient presented lower quadrant pain with nausea and vomiting but denied fever, chills, dysuria, and no vaginal discharge. Pregnancy test negative. WBC 6-10, squamous cells 21-50, and nitrate negative. Appears to be an unclean catch. Recommend following up with patient to ask about symptoms improvements and notify of culture information.    New antibiotic prescription: None  ED Provider: Aletta Edouard MD   Llana Aliment 10/11/2020, 9:40 AM Clinical Pharmacist Monday - Friday phone -  [phone] Saturday - Sunday phone - [phone]

## 2021-10-22 IMAGING — CT CT CERVICAL SPINE W/O CM
3 of 5 series · 13 of 33 positions shown, 16 images · non-contrast
Comparison: None.

CLINICAL DATA: Tripped and fell with trauma to the head neck.

EXAM:
CT HEAD WITHOUT CONTRAST
CT CERVICAL SPINE WITHOUT CONTRAST
TECHNIQUE: Multidetector CT imaging of the head and cervical spine was
performed following the standard protocol without intravenous
contrast. Multiplanar CT image reconstructions of the cervical spine
were also generated.

[Series 6: c_spine 2.0 sag bone · sagittal · 0.27mm/px · 5 of 61 slices shown, 6 images]
[im 21/61  bone]
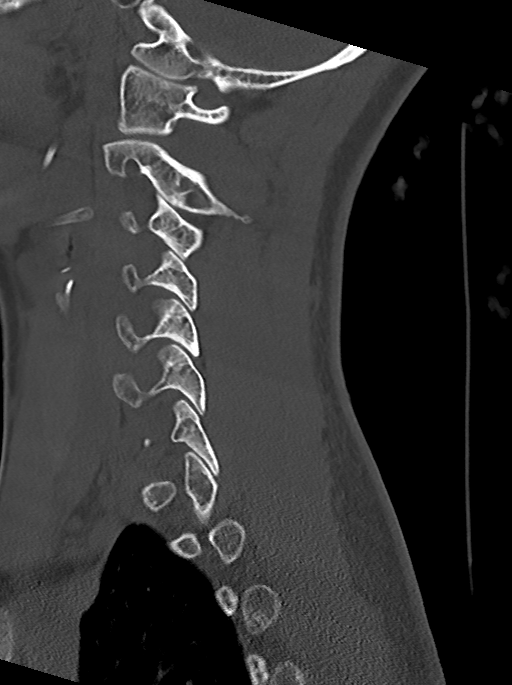
[im 26/61  bone]
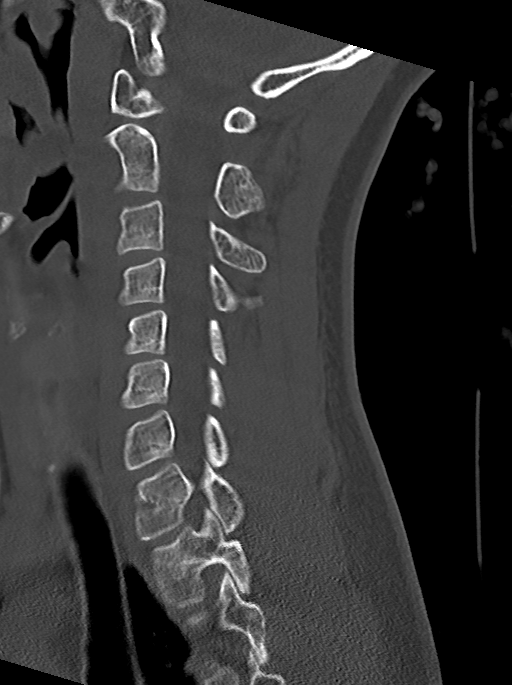
[im 31/61  soft-tissue]
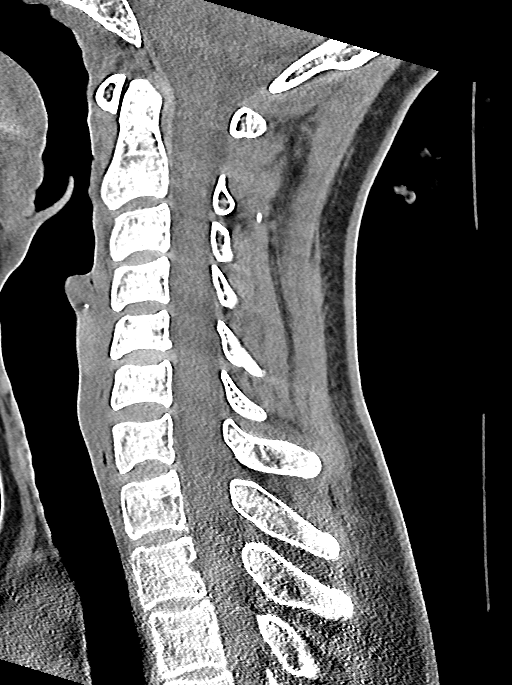
[im 31/61  bone]
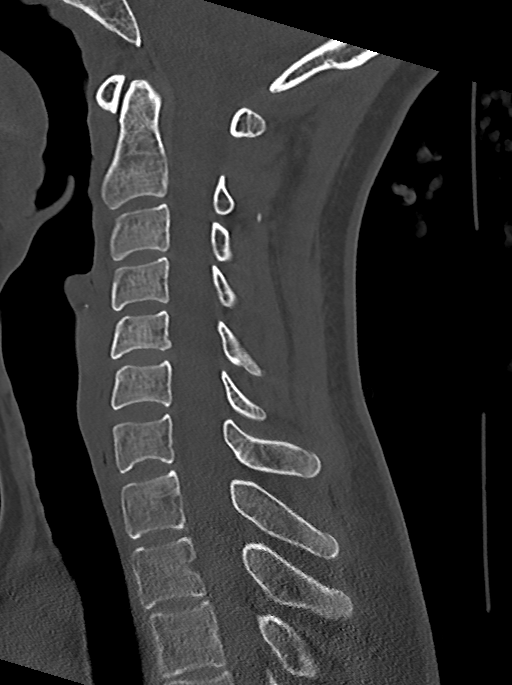
[im 36/61  bone]
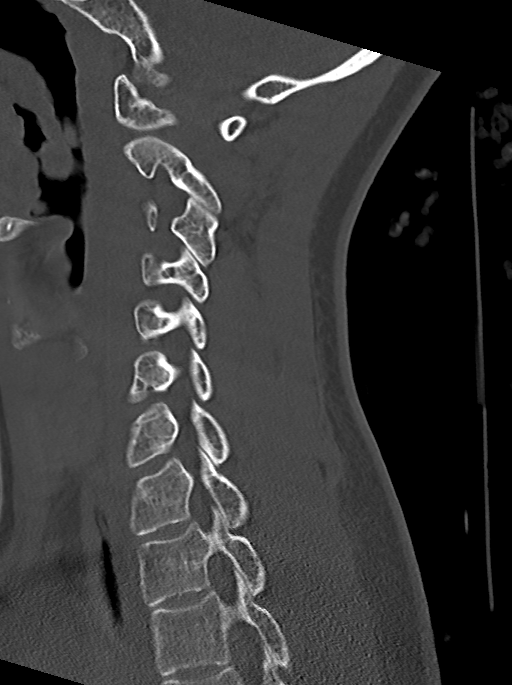
[im 41/61  bone]
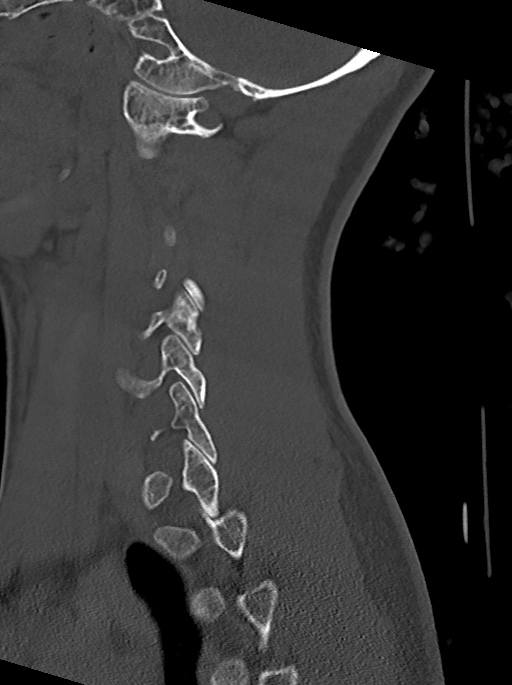

[Series 7: c_spine 2.0 cor bone · coronal · 0.27mm/px · 3 of 61 slices shown]
[im 13/61  bone]
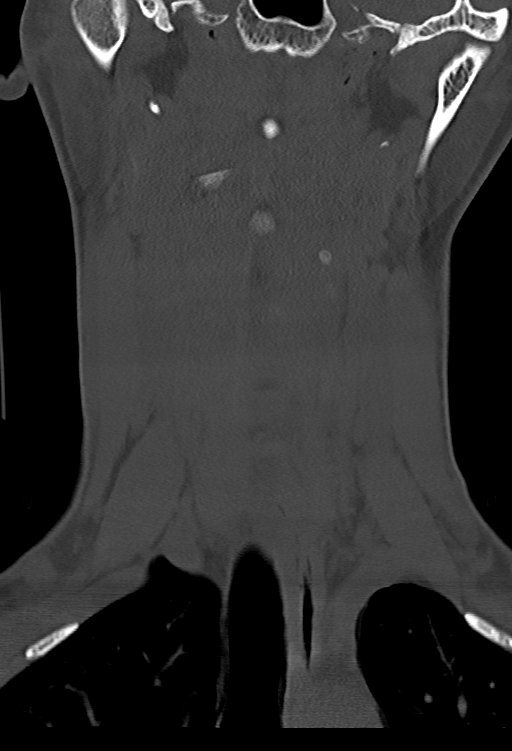
[im 25/61  bone]
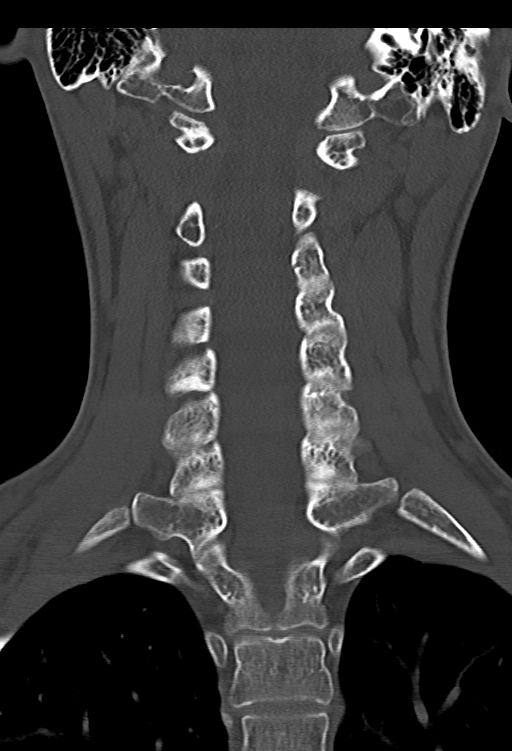
[im 37/61  bone]
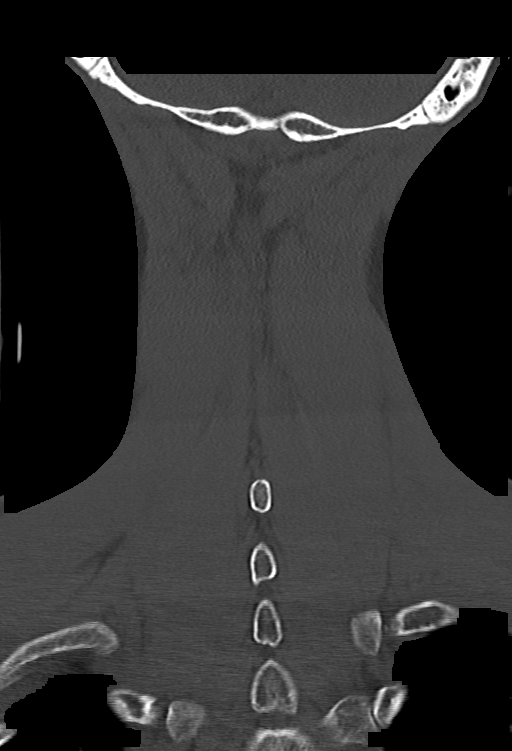

[Series 9: c_spine 1.0 st thins · axial · 0.37mm/px · z∈[-269,-137]mm · 5 of 265 slices shown, 7 images]
[im 38/265  soft-tissue]
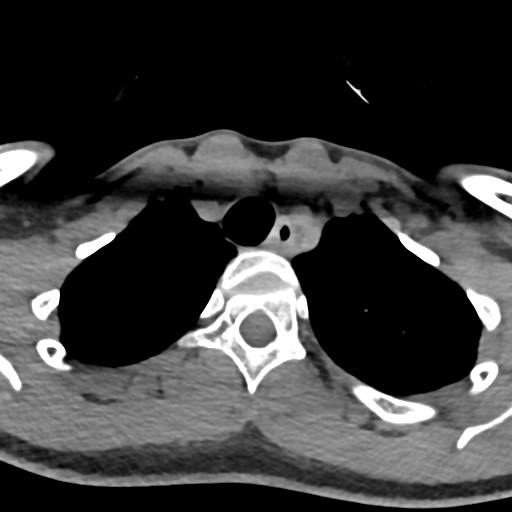
[im 38/265  bone]
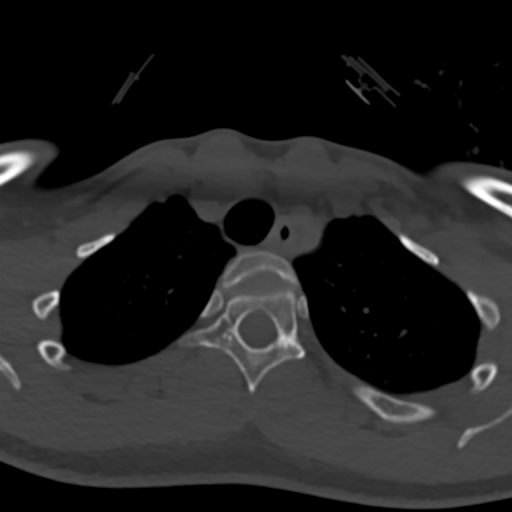
[im 76/265  bone]
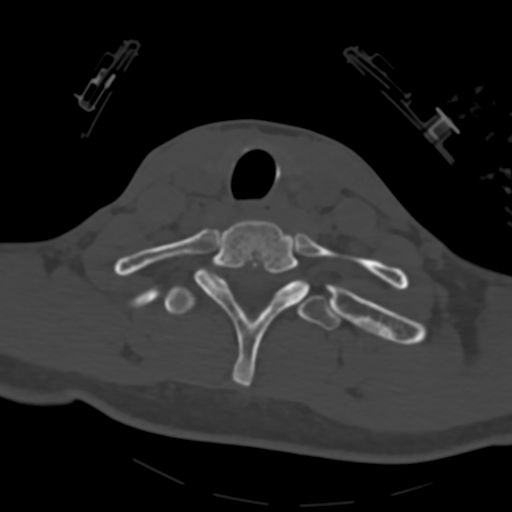
[im 151/265  bone]
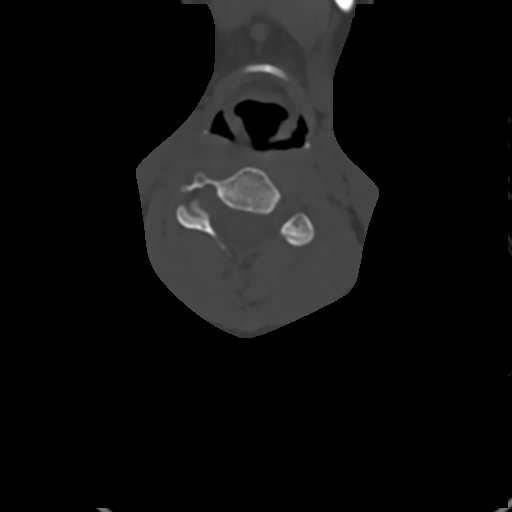
[im 189/265  bone]
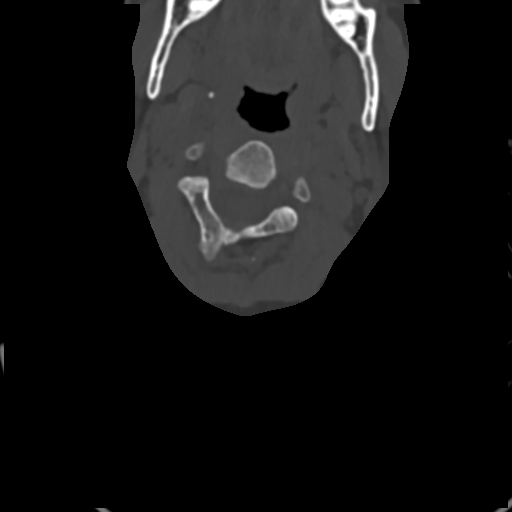
[im 227/265  soft-tissue]
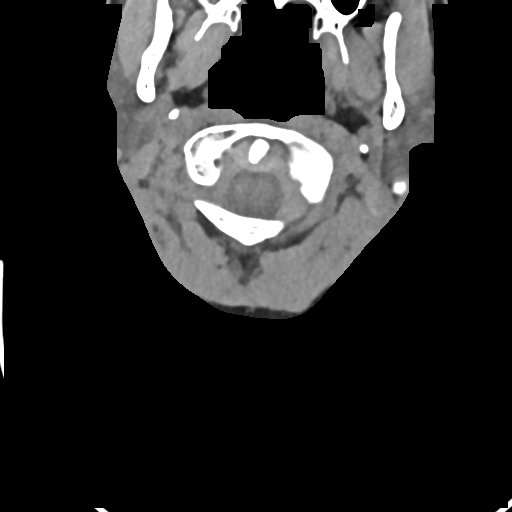
[im 227/265  bone]
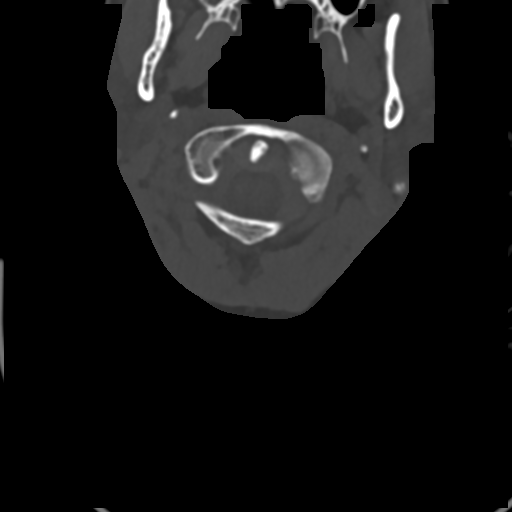

[13 of 33 positions shown; findings below may reference images not displayed]

FINDINGS: CT HEAD FINDINGS

Brain: The brain shows a normal appearance without evidence of
malformation, atrophy, old or acute small or large vessel
infarction, mass lesion, hemorrhage, hydrocephalus or extra-axial
collection.

Vascular: No hyperdense vessel. No evidence of atherosclerotic
calcification.

Skull: Normal.  No traumatic finding.  No focal bone lesion.

Sinuses/Orbits: Sinuses are clear. Orbits appear normal. Mastoids
are clear.

Other: None significant

CT CERVICAL SPINE FINDINGS

Alignment: Normal

Skull base and vertebrae: Normal

Soft tissues and spinal canal: Normal

Disc levels:  Normal

Upper chest: Normal

Other: None
IMPRESSION: Head CT: Normal.

Cervical spine CT: Normal.

## 2021-12-17 ENCOUNTER — Other Ambulatory Visit: Payer: Self-pay

## 2021-12-17 ENCOUNTER — Encounter (HOSPITAL_COMMUNITY): Payer: Self-pay

## 2021-12-17 ENCOUNTER — Emergency Department (HOSPITAL_COMMUNITY)
Admission: EM | Admit: 2021-12-17 | Discharge: 2021-12-18 | Disposition: A | Discharge status: Home / Self Care | Payer: BC Managed Care – PPO | Attending: Emergency Medicine | Admitting: Emergency Medicine

## 2021-12-17 DIAGNOSIS — N83291 Other ovarian cyst, right side: Secondary | ICD-10-CM | POA: Insufficient documentation

## 2021-12-17 DIAGNOSIS — N83201 Unspecified ovarian cyst, right side: Secondary | ICD-10-CM

## 2021-12-17 DIAGNOSIS — R1031 Right lower quadrant pain: Secondary | ICD-10-CM | POA: Diagnosis present

## 2021-12-17 DIAGNOSIS — N390 Urinary tract infection, site not specified: Secondary | ICD-10-CM | POA: Insufficient documentation

## 2021-12-17 NOTE — ED Triage Notes (Signed)
Pt complains of right ovary pain since Sunday.  ?

## 2021-12-18 ENCOUNTER — Emergency Department (HOSPITAL_COMMUNITY): Payer: BC Managed Care – PPO

## 2021-12-18 LAB — URINALYSIS, ROUTINE W REFLEX MICROSCOPIC
Bilirubin Urine: NEGATIVE
Glucose, UA: NEGATIVE mg/dL
Hgb urine dipstick: NEGATIVE
Ketones, ur: NEGATIVE mg/dL
Nitrite: NEGATIVE
Protein, ur: NEGATIVE mg/dL
Specific Gravity, Urine: 1.013 (ref 1.005–1.030)
pH: 7 (ref 5.0–8.0)

## 2021-12-18 LAB — CBC
HCT: 34.5 % — ABNORMAL LOW (ref 36.0–46.0)
Hemoglobin: 11.1 g/dL — ABNORMAL LOW (ref 12.0–15.0)
MCH: 27.1 pg (ref 26.0–34.0)
MCHC: 32.2 g/dL (ref 30.0–36.0)
MCV: 84.1 fL (ref 80.0–100.0)
Platelets: 283 10*3/uL (ref 150–400)
RBC: 4.1 MIL/uL (ref 3.87–5.11)
RDW: 13.3 % (ref 11.5–15.5)
WBC: 8.2 10*3/uL (ref 4.0–10.5)
nRBC: 0 % (ref 0.0–0.2)

## 2021-12-18 LAB — COMPREHENSIVE METABOLIC PANEL
ALT: 12 U/L (ref 0–44)
AST: 15 U/L (ref 15–41)
Albumin: 4 g/dL (ref 3.5–5.0)
Alkaline Phosphatase: 51 U/L (ref 38–126)
Anion gap: 4 — ABNORMAL LOW (ref 5–15)
BUN: 10 mg/dL (ref 6–20)
CO2: 26 mmol/L (ref 22–32)
Calcium: 8.9 mg/dL (ref 8.9–10.3)
Chloride: 107 mmol/L (ref 98–111)
Creatinine, Ser: 0.76 mg/dL (ref 0.44–1.00)
GFR, Estimated: >60 mL/min (ref >60)
Glucose, Bld: 105 mg/dL — ABNORMAL HIGH (ref 70–99)
Potassium: 3.8 mmol/L (ref 3.5–5.1)
Sodium: 137 mmol/L (ref 135–145)
Total Bilirubin: 0.7 mg/dL (ref 0.3–1.2)
Total Protein: 7.6 g/dL (ref 6.5–8.1)

## 2021-12-18 LAB — LIPASE, BLOOD: Lipase: 52 U/L — ABNORMAL HIGH (ref 11–51)

## 2021-12-18 LAB — I-STAT BETA HCG BLOOD, ED (MC, WL, AP ONLY): I-stat hCG, quantitative: <5 m[IU]/mL (ref <5)

## 2021-12-18 MED ORDER — CEPHALEXIN 500 MG PO CAPS
1000.0000 mg | ORAL_CAPSULE | Freq: Once | ORAL | Status: AC
  Administered 2021-12-18: 1000 mg via ORAL
  Filled 2021-12-18: qty 2

## 2021-12-19 LAB — URINE CULTURE: Culture: 60000 — AB

## 2021-12-19 NOTE — ED Provider Notes (Signed)
?[facility] DEPT ?Provider Note ? ? ?CSN: [id number] ?Arrival date & time: 12/17/21  2154 ? ?  ? ?History ? ?Chief Complaint  ?Patient presents with  ? Abdominal Pain  ? ? ?Dawn Zamora is a 31 y.o. female. ? ?31 year old female that has had some right lower pelvic pain for the last few days.  Also has some dysuria and suprapubic pain.  Patient states that she thinks this is ovaries that hurt.  She has no history of any cysts or torsed ovary.  Nausea or vomiting.  No diarrhea or constipation.  No other associated symptoms. ? ? ?Abdominal Pain ? ?  ? ?Home Medications ?Prior to Admission medications   ?Medication Sig Start Date End Date Taking? Authorizing Provider  ?Biotin 1000 MCG CHEW Chew 1 tablet by mouth daily.    [provider]  ?hydrOXYzine (ATARAX/VISTARIL) 25 MG tablet Take 1 tablet (25 mg total) by mouth 3 (three) times daily as needed for anxiety. ?Patient not taking: No sig reported 02/15/17   Patrecia Pour, NP  ?ibuprofen (ADVIL) 600 MG tablet Take 1 tablet (600 mg total) by mouth every 6 (six) hours as needed. ?Patient not taking: No sig reported 07/25/19   Jacqlyn Larsen, PA-C  ?methocarbamol (ROBAXIN) 500 MG tablet Take 1 tablet (500 mg total) by mouth 2 (two) times daily. ?Patient not taking: No sig reported 07/25/19   Jacqlyn Larsen, PA-C  ?Multiple Vitamins-Minerals (MULTIVITAMIN ADULT) CHEW Chew 2 tablets by mouth daily.    [provider]  ?naproxen (NAPROSYN) 500 MG tablet Take 1 tablet (500 mg total) by mouth 2 (two) times daily. ?Patient not taking: No sig reported 06/30/19   Jaynee Eagles, PA-C  ?norethindrone (AYGESTIN) 5 MG tablet Take 1 tablet (5 mg total) by mouth daily. ?Patient not taking: No sig reported 08/25/18   Ward, Ozella Almond, PA-C  ?traZODone (DESYREL) 50 MG tablet Take 1 tablet (50 mg total) by mouth at bedtime as needed for sleep. ?Patient not taking: No sig reported 02/15/17   Patrecia Pour, NP  ?   ? ?Allergies    ?Aspirin,  Coconut oil, Lemon oil, Lemon balm [melissa officinalis], Metronidazole, Copper-containing compounds, and Nickel   ? ?Review of Systems   ?Review of Systems  ?Gastrointestinal:  Positive for abdominal pain.  ? ?Physical Exam ?Updated Vital Signs ?BP 115/86   Pulse 75   Temp 98.1 ?F (36.7 ?C) (Oral)   Resp 17   Ht '5\' 5"'$  (1.651 m)   Wt 60.8 kg   SpO2 100%   BMI 22.30 kg/m?  ?Physical Exam ?Vitals and nursing note reviewed.  ?Constitutional:   ?   Appearance: She is well-developed.  ?HENT:  ?   Head: Normocephalic and atraumatic.  ?   Mouth/Throat:  ?   Mouth: Mucous membranes are moist.  ?   Pharynx: Oropharynx is clear.  ?Eyes:  ?   Pupils: Pupils are equal, round, and reactive to light.  ?Cardiovascular:  ?   Rate and Rhythm: Normal rate and regular rhythm.  ?Pulmonary:  ?   Effort: No respiratory distress.  ?   Breath sounds: No stridor.  ?Abdominal:  ?   General: There is no distension.  ?   Palpations: Abdomen is soft.  ?   Tenderness: There is no abdominal tenderness.  ?Musculoskeletal:     ?   General: No swelling or tenderness. Normal range of motion.  ?   Cervical back: Normal range of motion.  ?Skin: ?  General: Skin is warm and dry.  ?Neurological:  ?   General: No focal deficit present.  ?   Mental Status: She is alert.  ? ? ?ED Results / Procedures / Treatments   ?Labs ?(all labs ordered are listed, but only abnormal results are displayed) ?Labs Reviewed  ?LIPASE, BLOOD - Abnormal; Notable for the following components:  ?    Result Value  ? Lipase 52 (*)   ? All other components within normal limits  ?COMPREHENSIVE METABOLIC PANEL - Abnormal; Notable for the following components:  ? Glucose, Bld 105 (*)   ? Anion gap 4 (*)   ? All other components within normal limits  ?CBC - Abnormal; Notable for the following components:  ? Hemoglobin 11.1 (*)   ? HCT 34.5 (*)   ? All other components within normal limits  ?URINALYSIS, ROUTINE W REFLEX MICROSCOPIC - Abnormal; Notable for the following  components:  ? APPearance HAZY (*)   ? Leukocytes,Ua MODERATE (*)   ? Bacteria, UA MANY (*)   ? All other components within normal limits  ?URINE CULTURE  ?I-STAT BETA HCG BLOOD, ED ([facility], [facility], [facility] ONLY)  ? ? ?EKG ?None ? ?Radiology ?US PELVIC COMPLETE W TRANSVAGINAL AND TORSION R/O ? ?Result Date: 12/18/2021 ?CLINICAL DATA:  Pelvic pain for 3 days EXAM: TRANSABDOMINAL AND TRANSVAGINAL ULTRASOUND OF PELVIS DOPPLER ULTRASOUND OF OVARIES TECHNIQUE: Both transabdominal and transvaginal ultrasound examinations of the pelvis were performed. Transabdominal technique was performed for global imaging of the pelvis including uterus, ovaries, adnexal regions, and pelvic cul-de-sac. It was necessary to proceed with endovaginal exam following the transabdominal exam to visualize the 10/07/2020. Color and duplex Doppler ultrasound was utilized to evaluate blood flow to the ovaries. COMPARISON:  10/07/2020 FINDINGS: Uterus Measurements: 8 x 4 x 6 cm = volume: 100 mL. No fibroids or other mass visualized. Endometrium Thickness: 9 mm.  No focal abnormality visualized. Right ovary Measurements: 43 x 35 x 35 mm = volume: 27 mL. Rounded structure with accentuated peripheral flow and internal lace-like appearance, up to 3 cm in diameter. Left ovary Measurements: 34 x 18 x 27 mm = volume: 9 mL. Normal appearance/no adnexal mass. Pulsed Doppler evaluation of both ovaries demonstrates normal low-resistance arterial and venous waveforms. Other findings Small volume free pelvic fluid which appears simple. IMPRESSION: 1. Complex cyst in the right ovary most consistent with hemorrhagic corpus luteum, 3 cm in diameter. 2. Free pelvic fluid Electronically Signed   By: Jorje Guild M.D.   On: 12/18/2021 05:51   ? ?Procedures ?Procedures  ? ? ?Medications Ordered in ED ?Medications  ?cephALEXin (KEFLEX) capsule 1,000 mg (1,000 mg Oral Given 12/18/21 0630)  ? ? ?ED Course/ Medical Decision Making/ A&P ?  ?                        ?Medical Decision  Making ?Amount and/or Complexity of Data Reviewed ?Labs: ordered. ?Radiology: ordered. ? ?Risk ?Prescription drug management. ? ? ?Urine with likely infection.  Culture added on.  Antibiotic started.  Also found to have an ovarian cyst.  This is probably the cause of her symptoms.  She will follow-up with OB for the same ? ? ?  ?Final Clinical Impression(s) / ED Diagnoses ?Final diagnoses:  ?Right lower quadrant abdominal pain  ?Cyst of right ovary  ?Urinary tract infection without hematuria, site unspecified  ? ? ?Rx / DC Orders ?ED Discharge Orders   ? ? None  ? ?  ? ? ?  ?  Merrily Pew, MD ?12/19/21 [phone] ? ?

## 2021-12-20 ENCOUNTER — Telehealth: Payer: Self-pay | Admitting: *Deleted

## 2021-12-20 NOTE — Telephone Encounter (Signed)
Post ED Visit - Positive Culture Follow-up ? ?Culture report reviewed by antimicrobial stewardship pharmacist: ?[place] Team ?'[]'$  Elenor Quinones, Pharm.D. ?'[]'$  Heide Guile, Pharm.D., BCPS AQ-ID ?'[]'$  Parks Neptune, Pharm.D., BCPS ?'[]'$  Alycia Rossetti, Pharm.D., BCPS ?'[]'$  [place], Pharm.D., BCPS, AAHIVP ?'[]'$  Legrand Como, Pharm.D., BCPS, AAHIVP ?'[]'$  Salome Arnt, PharmD, BCPS ?'[]'$  Johnnette Gourd, PharmD, BCPS ?'[]'$  Hughes Better, PharmD, BCPS ?'[]'$  Leeroy Cha, PharmD ?'[]'$  Laqueta Linden, PharmD, BCPS ?'[]'$  Albertina Parr, PharmD ? ?[place] Team ?'[]'$  Leodis Sias, PharmD ?'[]'$  Lindell Spar, PharmD ?'[]'$  Royetta Asal, PharmD ?'[]'$  Graylin Shiver, Rph ?'[]'$  Rema Fendt) Glennon Mac, PharmD ?'[]'$  Arlyn Dunning, PharmD ?'[]'$  Netta Cedars, PharmD ?'[]'$  Dia Sitter, PharmD ?'[]'$  Leone Haven, PharmD ?'[]'$  Gretta Arab, PharmD ?'[]'$  Theodis Shove, PharmD ?'[]'$  Peggyann Juba, PharmD ?'[]'$  Reuel Boom, PharmD ? ? ?Positive urine culture ?No treatment needed and no further patient follow-up is required at this time.  Alycia Rossetti, PharmD ? ?Ardeen Fillers ?12/20/2021, 11:06 AM ?  ?

## 2023-02-04 LAB — OB RESULTS CONSOLE HIV ANTIBODY (ROUTINE TESTING): HIV: NONREACTIVE

## 2023-02-04 LAB — OB RESULTS CONSOLE HEPATITIS B SURFACE ANTIGEN: Hepatitis B Surface Ag: NEGATIVE

## 2023-02-04 LAB — OB RESULTS CONSOLE RPR: RPR: NONREACTIVE

## 2023-02-04 LAB — OB RESULTS CONSOLE RUBELLA ANTIBODY, IGM: Rubella: IMMUNE

## 2023-02-10 DIAGNOSIS — R8271 Bacteriuria: Secondary | ICD-10-CM | POA: Insufficient documentation

## 2023-03-09 ENCOUNTER — Encounter (HOSPITAL_COMMUNITY): Payer: Self-pay

## 2023-03-09 ENCOUNTER — Inpatient Hospital Stay (HOSPITAL_COMMUNITY)
Admission: AD | Admit: 2023-03-09 | Discharge: 2023-03-09 | Disposition: A | Discharge status: Home / Self Care | Payer: BC Managed Care – PPO | Attending: Obstetrics and Gynecology | Admitting: Obstetrics and Gynecology

## 2023-03-09 DIAGNOSIS — O26892 Other specified pregnancy related conditions, second trimester: Secondary | ICD-10-CM | POA: Insufficient documentation

## 2023-03-09 DIAGNOSIS — Z87891 Personal history of nicotine dependence: Secondary | ICD-10-CM | POA: Diagnosis not present

## 2023-03-09 DIAGNOSIS — H538 Other visual disturbances: Secondary | ICD-10-CM | POA: Insufficient documentation

## 2023-03-09 DIAGNOSIS — O2652 Maternal hypotension syndrome, second trimester: Secondary | ICD-10-CM

## 2023-03-09 DIAGNOSIS — R55 Syncope and collapse: Secondary | ICD-10-CM | POA: Diagnosis not present

## 2023-03-09 DIAGNOSIS — Z3A14 14 weeks gestation of pregnancy: Secondary | ICD-10-CM | POA: Diagnosis not present

## 2023-03-09 LAB — URINALYSIS, ROUTINE W REFLEX MICROSCOPIC
Bilirubin Urine: NEGATIVE
Glucose, UA: NEGATIVE mg/dL
Hgb urine dipstick: NEGATIVE
Ketones, ur: NEGATIVE mg/dL
Leukocytes,Ua: NEGATIVE
Nitrite: NEGATIVE
Protein, ur: NEGATIVE mg/dL
Specific Gravity, Urine: 1.024 (ref 1.005–1.030)
pH: 5 (ref 5.0–8.0)

## 2023-03-09 NOTE — MAU Provider Note (Incomplete)
History     CSN: [id number]  Arrival date and time: 03/09/23 2156   Event Date/Time   First Provider Initiated Contact with Patient 03/09/23 2256      No chief complaint on file.  Dawn Zamora is a 32 y.o. G3P1011 at [redacted]w[redacted]d who receives care at [facility].  She presents today for dehydration.  She states earlier today.  She reports that she had been drinking water throughout the day, but has increased since the incident.     OB History     Gravida  3   Para  1   Term  1   Preterm      AB  1   Living  1      SAB      IAB  1   Ectopic      Multiple      Live Births  1           Past Medical History:  Diagnosis Date  . Anemia   . Anxiety   . Asthma   . Dysrhythmia   . Generalized headaches   . Hx of chlamydia infection     Past Surgical History:  Procedure Laterality Date  . NO PAST SURGERIES      Family History  Problem Relation Age of Onset  . Polydactyly Paternal Uncle   . Sickle cell anemia Cousin   . Diabetes Mother   . Diabetes Maternal Aunt   . Diabetes Maternal Grandmother   . Diabetes Maternal Grandfather     Social History   Tobacco Use  . Smoking status: Former    Current packs/day: 0.00    Types: Cigarettes    Quit date: 10/20/2010    Years since quitting: 12.3  . Smokeless tobacco: Never  Vaping Use  . Vaping status: Never Used  Substance Use Topics  . Alcohol use: Not Currently  . Drug use: No    Allergies:  Allergies  Allergen Reactions  . Aspirin Shortness Of Breath    Patient told not to take aspirin due to respiratory distress.  . Coconut (Cocos Nucifera) Anaphylaxis  . Lemon Oil Hives  . Lemon Balm [Melissa Officinalis] Hives  . Metronidazole Nausea And Vomiting    diarrhea  . Copper-Containing Compounds Rash  . Nickel Rash    Medications Prior to Admission  Medication Sig Dispense Refill Last Dose  . Biotin 1000 MCG CHEW Chew 1 tablet by mouth daily.     . hydrOXYzine (ATARAX/VISTARIL) 25 MG tablet  Take 1 tablet (25 mg total) by mouth 3 (three) times daily as needed for anxiety. (Patient not taking: No sig reported) 30 tablet 0   . ibuprofen (ADVIL) 600 MG tablet Take 1 tablet (600 mg total) by mouth every 6 (six) hours as needed. (Patient not taking: No sig reported) 30 tablet 0   . methocarbamol (ROBAXIN) 500 MG tablet Take 1 tablet (500 mg total) by mouth 2 (two) times daily. (Patient not taking: No sig reported) 20 tablet 0   . Multiple Vitamins-Minerals (MULTIVITAMIN ADULT) CHEW Chew 2 tablets by mouth daily.     . naproxen (NAPROSYN) 500 MG tablet Take 1 tablet (500 mg total) by mouth 2 (two) times daily. (Patient not taking: No sig reported) 30 tablet 0   . norethindrone (AYGESTIN) 5 MG tablet Take 1 tablet (5 mg total) by mouth daily. (Patient not taking: No sig reported) 7 tablet 0   . traZODone (DESYREL) 50 MG tablet Take 1 tablet (50 mg total)  by mouth at bedtime as needed for sleep. (Patient not taking: No sig reported) 30 tablet 0     Review of Systems  Constitutional:  Negative for chills and fever.  Eyes:  Negative for visual disturbance.  Gastrointestinal:  Positive for nausea. Negative for vomiting.  Neurological:  Positive for dizziness and headaches (None currently). Negative for light-headedness.   Physical Exam   Blood pressure 113/63, pulse 69, temperature 97.9 F (36.6 C), resp. rate 18, height 5\' 3"  (1.6 m), weight 65.3 kg, unknown if currently breastfeeding.  Physical Exam  MAU Course  Procedures  MDM ***  Assessment and Plan  ***  Cherre Robins 03/09/2023, 10:56 PM

## 2023-03-09 NOTE — MAU Provider Note (Incomplete)
History     CSN: [id number]  Arrival date and time: 03/09/23 2156   Event Date/Time   First Provider Initiated Contact with Patient 03/09/23 2256      No chief complaint on file.  Dawn Zamora is a 32 y.o. G3P1011 at [redacted]w[redacted]d who receives care at [facility].  She presents today for dehydration.  She states earlier today she had a bout of dizziness and darkened vision when going from bending to standing quickly. She states she called her primary office and was told to hydrate, but to come in if symptoms did not improve.  Patient reports that she had been drinking water throughout the day, but had increased it after the incident.  Patient also reports that she also drank a liquid IV.  Patient endorses a history of anemia, prior to pregnancy, but is not currently on iron supplementation.  Patient denies abdominal pain or vaginal bleeding/discharge.   Review of [facility] prenatal records   OB History     Gravida  3   Para  1   Term  1   Preterm      AB  1   Living  1      SAB      IAB  1   Ectopic      Multiple      Live Births  1           Past Medical History:  Diagnosis Date   Anemia    Anxiety    Asthma    Dysrhythmia    Generalized headaches    Hx of chlamydia infection     Past Surgical History:  Procedure Laterality Date   NO PAST SURGERIES      Family History  Problem Relation Age of Onset   Polydactyly Paternal Uncle    Sickle cell anemia Cousin    Diabetes Mother    Diabetes Maternal Aunt    Diabetes Maternal Grandmother    Diabetes Maternal Grandfather     Social History   Tobacco Use   Smoking status: Former    Current packs/day: 0.00    Types: Cigarettes    Quit date: 10/20/2010    Years since quitting: 12.3   Smokeless tobacco: Never  Vaping Use   Vaping status: Never Used  Substance Use Topics   Alcohol use: Not Currently   Drug use: No    Allergies:  Allergies  Allergen Reactions   Aspirin Shortness Of Breath    Patient told  not to take aspirin due to respiratory distress.   Coconut (Cocos Nucifera) Anaphylaxis   Lemon Oil Hives   Lemon Balm [Melissa Officinalis] Hives   Metronidazole Nausea And Vomiting    diarrhea   Copper-Containing Compounds Rash   Nickel Rash    Medications Prior to Admission  Medication Sig Dispense Refill Last Dose   Biotin 1000 MCG CHEW Chew 1 tablet by mouth daily.      hydrOXYzine (ATARAX/VISTARIL) 25 MG tablet Take 1 tablet (25 mg total) by mouth 3 (three) times daily as needed for anxiety. (Patient not taking: No sig reported) 30 tablet 0    ibuprofen (ADVIL) 600 MG tablet Take 1 tablet (600 mg total) by mouth every 6 (six) hours as needed. (Patient not taking: No sig reported) 30 tablet 0    methocarbamol (ROBAXIN) 500 MG tablet Take 1 tablet (500 mg total) by mouth 2 (two) times daily. (Patient not taking: No sig reported) 20 tablet 0    Multiple Vitamins-Minerals (MULTIVITAMIN  ADULT) CHEW Chew 2 tablets by mouth daily.      naproxen (NAPROSYN) 500 MG tablet Take 1 tablet (500 mg total) by mouth 2 (two) times daily. (Patient not taking: No sig reported) 30 tablet 0    norethindrone (AYGESTIN) 5 MG tablet Take 1 tablet (5 mg total) by mouth daily. (Patient not taking: No sig reported) 7 tablet 0    traZODone (DESYREL) 50 MG tablet Take 1 tablet (50 mg total) by mouth at bedtime as needed for sleep. (Patient not taking: No sig reported) 30 tablet 0     Review of Systems  Constitutional:  Negative for chills and fever.  Eyes:  Negative for visual disturbance.  Gastrointestinal:  Positive for nausea. Negative for vomiting.  Neurological:  Positive for dizziness and headaches (None currently). Negative for light-headedness.   Physical Exam   Blood pressure 113/63, pulse 69, temperature 97.9 F (36.6 C), resp. rate 18, height 5\' 3"  (1.6 m), weight 65.3 kg, unknown if currently breastfeeding.  Orthostatic VS for the past 24 hrs:  BP- Lying Pulse- Lying BP- Sitting Pulse- Sitting  BP- Standing at 0 minutes Pulse- Standing at 0 minutes  03/09/23 2242 120/70 72 114/68 71 116/67 73   Standing at 3 minutes: 106/69 P 79  Physical Exam Vitals reviewed.  Constitutional:      General: She is not in acute distress.    Appearance: Normal appearance. She is not toxic-appearing.  HENT:     Head: Normocephalic and atraumatic.  Eyes:     Conjunctiva/sclera: Conjunctivae normal.  Cardiovascular:     Rate and Rhythm: Normal rate.  Pulmonary:     Effort: Pulmonary effort is normal. No respiratory distress.  Musculoskeletal:        General: Normal range of motion.     Cervical back: Normal range of motion.  Skin:    General: Skin is warm and dry.  Neurological:     Mental Status: She is alert and oriented to person, place, and time.  Psychiatric:        Mood and Affect: Mood normal.        Behavior: Behavior normal.     MAU Course  Procedures Results for orders placed or performed during the hospital encounter of 03/09/23 (from the past 24 hour(s))  Urinalysis, Routine w reflex microscopic -Urine, Clean Catch     Status: Abnormal   Collection Time: 03/09/23 10:15 PM  Result Value Ref Range   Color, Urine YELLOW YELLOW   APPearance HAZY (A) CLEAR   Specific Gravity, Urine 1.024 1.005 - 1.030   pH 5.0 5.0 - 8.0   Glucose, UA NEGATIVE NEGATIVE mg/dL   Hgb urine dipstick NEGATIVE NEGATIVE   Bilirubin Urine NEGATIVE NEGATIVE   Ketones, ur NEGATIVE NEGATIVE mg/dL   Protein, ur NEGATIVE NEGATIVE mg/dL   Nitrite NEGATIVE NEGATIVE   Leukocytes,Ua NEGATIVE NEGATIVE    MDM UA Vitals Signs Assessment and Plan  32 year old G3P1011 at 14.5 weeks Near Syncope  -UA collected and returns as above. -Orthostatic vitals completed and as noted. -Provider to bedside to discuss results. -Informed that findings are not suggestive of dehydration. -Reviewed changes in bp with potential cause for symptoms. -Educated on maternal hypotension and how to accommodate: encouraged  increased fluid intake, rest when needed, and proper nutritional intake. -Reviewed labs from office and informed no current anemia as of June, but if symptoms worsen she should request additional testing. -Patient verbalizes understanding and without further questions. -Precautions reviewed. -Encouraged to call  primary office or return to MAU if symptoms worsen or with the onset of new symptoms. -Discharged to home in stable condition.   Cherre Robins 03/09/2023, 10:56 PM

## 2023-03-09 NOTE — MAU Note (Signed)
.  Dawn Zamora is a 32 y.o. at [redacted]w[redacted]d here in MAU reporting: lightheaded, seeing floaters of black with quick movement earlier today. Pt called her OB on call, and she was told to drink water or come in to MAU for possible dehydrated. Pt reports nausea no episodes of vomiting today, on Diclegis. Pt reports she has been able to eat, and tries to drink water to stay hydrated. Pt denies VB or LOF.   Onset of complaint: today  Pain score: 0/10 Vitals:   03/09/23 2214  BP: 113/63  Pulse: 69  Resp: 18  Temp: 97.9 F (36.6 C)     FHT:155 Lab orders placed from triage:  UA

## 2023-03-10 ENCOUNTER — Other Ambulatory Visit: Payer: Self-pay | Admitting: Obstetrics and Gynecology

## 2023-03-10 DIAGNOSIS — Z363 Encounter for antenatal screening for malformations: Secondary | ICD-10-CM

## 2023-03-24 ENCOUNTER — Emergency Department (HOSPITAL_COMMUNITY)
Admission: EM | Admit: 2023-03-24 | Discharge: 2023-03-24 | Disposition: A | Discharge status: Home / Self Care | Payer: BC Managed Care – PPO | Attending: Emergency Medicine | Admitting: Emergency Medicine

## 2023-03-24 ENCOUNTER — Encounter (HOSPITAL_COMMUNITY): Payer: Self-pay

## 2023-03-24 ENCOUNTER — Emergency Department (HOSPITAL_COMMUNITY): Payer: BC Managed Care – PPO

## 2023-03-24 ENCOUNTER — Other Ambulatory Visit: Payer: Self-pay

## 2023-03-24 DIAGNOSIS — O26892 Other specified pregnancy related conditions, second trimester: Secondary | ICD-10-CM | POA: Diagnosis present

## 2023-03-24 DIAGNOSIS — R1013 Epigastric pain: Secondary | ICD-10-CM | POA: Insufficient documentation

## 2023-03-24 DIAGNOSIS — Z3A17 17 weeks gestation of pregnancy: Secondary | ICD-10-CM | POA: Insufficient documentation

## 2023-03-24 DIAGNOSIS — R079 Chest pain, unspecified: Secondary | ICD-10-CM | POA: Insufficient documentation

## 2023-03-24 DIAGNOSIS — R918 Other nonspecific abnormal finding of lung field: Secondary | ICD-10-CM | POA: Insufficient documentation

## 2023-03-24 LAB — COMPREHENSIVE METABOLIC PANEL
ALT: 15 U/L (ref 0–44)
AST: 15 U/L (ref 15–41)
Albumin: 3.1 g/dL — ABNORMAL LOW (ref 3.5–5.0)
Alkaline Phosphatase: 46 U/L (ref 38–126)
Anion gap: 13 (ref 5–15)
BUN: <5 mg/dL — ABNORMAL LOW (ref 6–20)
CO2: 22 mmol/L (ref 22–32)
Calcium: 8.9 mg/dL (ref 8.9–10.3)
Chloride: 98 mmol/L (ref 98–111)
Creatinine, Ser: 0.67 mg/dL (ref 0.44–1.00)
GFR, Estimated: >60 mL/min (ref >60)
Glucose, Bld: 89 mg/dL (ref 70–99)
Potassium: 3.6 mmol/L (ref 3.5–5.1)
Sodium: 133 mmol/L — ABNORMAL LOW (ref 135–145)
Total Bilirubin: 0.4 mg/dL (ref 0.3–1.2)
Total Protein: 6.5 g/dL (ref 6.5–8.1)

## 2023-03-24 LAB — CBC WITH DIFFERENTIAL/PLATELET
Abs Immature Granulocytes: 0.02 10*3/uL (ref 0.00–0.07)
Basophils Absolute: 0 10*3/uL (ref 0.0–0.1)
Basophils Relative: 0 %
Eosinophils Absolute: 0.1 10*3/uL (ref 0.0–0.5)
Eosinophils Relative: 2 %
HCT: 30.5 % — ABNORMAL LOW (ref 36.0–46.0)
Hemoglobin: 10.5 g/dL — ABNORMAL LOW (ref 12.0–15.0)
Immature Granulocytes: 0 %
Lymphocytes Relative: 17 %
Lymphs Abs: 1.5 10*3/uL (ref 0.7–4.0)
MCH: 29.4 pg (ref 26.0–34.0)
MCHC: 34.4 g/dL (ref 30.0–36.0)
MCV: 85.4 fL (ref 80.0–100.0)
Monocytes Absolute: 0.6 10*3/uL (ref 0.1–1.0)
Monocytes Relative: 7 %
Neutro Abs: 6.6 10*3/uL (ref 1.7–7.7)
Neutrophils Relative %: 74 %
Platelets: 260 10*3/uL (ref 150–400)
RBC: 3.57 MIL/uL — ABNORMAL LOW (ref 3.87–5.11)
RDW: 13.3 % (ref 11.5–15.5)
WBC: 8.9 10*3/uL (ref 4.0–10.5)
nRBC: 0 % (ref 0.0–0.2)

## 2023-03-24 LAB — LIPASE, BLOOD: Lipase: 54 U/L — ABNORMAL HIGH (ref 11–51)

## 2023-03-24 MED ORDER — CEFDINIR 300 MG PO CAPS
300.0000 mg | ORAL_CAPSULE | Freq: Two times a day (BID) | ORAL | Status: DC
  Administered 2023-03-24: 300 mg via ORAL
  Filled 2023-03-24 (×2): qty 1

## 2023-03-24 MED ORDER — FAMOTIDINE 20 MG PO TABS
20.0000 mg | ORAL_TABLET | Freq: Once | ORAL | Status: AC
  Administered 2023-03-24: 20 mg via ORAL
  Filled 2023-03-24: qty 1

## 2023-03-24 MED ORDER — ALUM & MAG HYDROXIDE-SIMETH 200-200-20 MG/5ML PO SUSP
30.0000 mL | Freq: Once | ORAL | Status: AC
  Administered 2023-03-24: 30 mL via ORAL
  Filled 2023-03-24: qty 30

## 2023-03-24 MED ORDER — CEFDINIR 300 MG PO CAPS
300.0000 mg | ORAL_CAPSULE | Freq: Two times a day (BID) | ORAL | 0 refills | Status: AC
Start: 2023-03-24 — End: 2023-04-02

## 2023-03-24 NOTE — ED Triage Notes (Addendum)
Reports chest pain that started 3 days ago that has been intermittent.  Patient reports pain goes into her back.  Reports mild shortness of breath. Patient is almost 17 weeks preg.  Called her PCP and was told to come to ER

## 2023-03-24 NOTE — ED Notes (Signed)
Patient transported to X-ray 

## 2023-03-24 NOTE — Discharge Instructions (Addendum)
Please follow-up with your OB/GYN doctor for recheck of your symptoms this week.  You had blood test, x-ray, and an ultrasound of your gallbladder performed in the ER today.  Your gallbladder appears normal and your blood work is reassuring.  However, on the x-ray, there is a possibility of a small pneumonia or infection on the left lower side of your lungs.  We decided to treat you with an antibiotic for this.  You will take the antibiotic as prescribed and complete the full course. Your next dose of antibiotic is due TONIGHT with dinner.   If your symptoms are coming from an early pneumonia, I expect to see improvement of your pain over the next 48 hours.  (However you need to complete the full course of antibiotics regardless of whether your symptoms are improving.)  If your symptoms or not improving will need to follow-up with the OBGYN provider in the office.  Another possibility is that you are experiencing gastritis or stomach upset from your vomiting and nausea.  We did try giving you stomach medications the ER that did not appear to improve your symptoms very much.  If you have worsening chest pain, difficulty breathing, lightheadedness, loss of consciousness, or any other life-threatening concerns, please return to the emergency department.

## 2023-03-24 NOTE — ED Provider Notes (Addendum)
[place] EMERGENCY DEPARTMENT AT [facility] Provider Note   CSN: [id number] Arrival date & time: 03/24/23  1047     History  Chief Complaint  Patient presents with   Chest Pain    Dawn Zamora is a 32 y.o. female at [redacted] weeks gestation presenting to ED with epigastric pain. Reports symptoms started 3 days ago, then went away completely, and started up last night into this morning.  She describes a sharp discomfort in her epigastrium and is worse with inspiration and lying flat.  She says she had serious and significant early morning sickness with her pregnancy, constant dry heaving and vomiting.  She takes likely just for that.  She reports no other complications with her pregnancy.  This is her third pregnancy, with 1 living child preceding this.  She denies any personal or family history of DVT or PE.  Denies any unilateral leg swelling or pain.  HPI     Home Medications Prior to Admission medications   Medication Sig Start Date End Date Taking? Authorizing Provider  cefdinir (OMNICEF) 300 MG capsule Take 1 capsule (300 mg total) by mouth 2 (two) times daily for 9 days. 03/24/23 04/02/23 Yes Ashvin Adelson, Kermit Balo, MD  Cholecalciferol (VITAMIN D3) 50 MCG (2000 UT) capsule Take 4,000 Units by mouth daily. 02/11/23  Yes [provider]  Doxylamine-Pyridoxine 10-10 MG TBEC Take 1 tablet by mouth 2 (two) times daily. 02/17/23  Yes [provider]  Prenatal Vit-Fe Fumarate-FA (PRENATAL PO) Take 1 tablet by mouth daily.   Yes [provider]      Allergies    Asa [aspirin], Coconut (cocos nucifera), Lemon oil, Lemon balm [melissa officinalis], Metronidazole, Phenergan [promethazine], Copper-containing compounds, and Nickel    Review of Systems   Review of Systems  Physical Exam Updated Vital Signs BP 109/64   Pulse 79   Temp 98.1 F (36.7 C) (Oral)   Resp 19   Ht 5\' 3"  (1.6 m)   Wt 64.9 kg   SpO2 100%   BMI 25.33 kg/m  Physical  Exam Constitutional:      General: She is not in acute distress. HENT:     Head: Normocephalic and atraumatic.  Eyes:     Conjunctiva/sclera: Conjunctivae normal.     Pupils: Pupils are equal, round, and reactive to light.  Cardiovascular:     Rate and Rhythm: Normal rate and regular rhythm.  Pulmonary:     Effort: Pulmonary effort is normal. No respiratory distress.  Abdominal:     General: There is no distension.     Tenderness: There is abdominal tenderness in the epigastric area.  Skin:    General: Skin is warm and dry.  Neurological:     General: No focal deficit present.     Mental Status: She is alert. Mental status is at baseline.  Psychiatric:        Mood and Affect: Mood normal.        Behavior: Behavior normal.     ED Results / Procedures / Treatments   Labs (all labs ordered are listed, but only abnormal results are displayed) Labs Reviewed  COMPREHENSIVE METABOLIC PANEL - Abnormal; Notable for the following components:      Result Value   Sodium 133 (*)    BUN <5 (*)    Albumin 3.1 (*)    All other components within normal limits  CBC WITH DIFFERENTIAL/PLATELET - Abnormal; Notable for the following components:   RBC 3.57 (*)  Hemoglobin 10.5 (*)    HCT 30.5 (*)    All other components within normal limits  LIPASE, BLOOD - Abnormal; Notable for the following components:   Lipase 54 (*)    All other components within normal limits    EKG EKG Interpretation Date/Time:  Tuesday March 24 2023 10:56:20 EDT Ventricular Rate:  78 PR Interval:  139 QRS Duration:  89 QT Interval:  379 QTC Calculation: 432 R Axis:   70  Text Interpretation: Sinus rhythm Borderline T abnormalities, anterior leads Confirmed by Alvester Chou [phone]) on 03/24/2023 12:07:23 PM  Radiology US Abdomen Limited RUQ (LIVER/GB)  Result Date: 03/24/2023 CLINICAL DATA:  Abdominal pain EXAM: ULTRASOUND ABDOMEN LIMITED RIGHT UPPER QUADRANT COMPARISON:  CT abdomen pelvis contrast  10/07/2020 FINDINGS: Gallbladder: Gallstones: None Sludge: None Gallbladder Wall: Within normal limits Pericholecystic fluid: None Sonographic Murphy's Sign: Negative per technologist Common bile duct: Diameter: 4 mm Liver: Parenchymal echogenicity: Within normal limits Contours: Normal Lesions: None Portal vein: Patent.  Hepatopetal flow Other: None. IMPRESSION: No significant sonographic abnormality of the liver or gallbladder. Electronically Signed   By: Acquanetta Belling M.D.   On: 03/24/2023 13:17   DG Chest 2 View  Result Date: 03/24/2023 CLINICAL DATA:  Chest discomfort.  Chest pain starting 3 days ago. EXAM: CHEST - 2 VIEW COMPARISON:  Chest radiographs 07/25/2019 and 10/26/2010 FINDINGS: Cardiac silhouette and mediastinal contours are within normal limits. There is minimal blunting of the left costophrenic angle. There appears to be minimal posterior inferior lung linear density on lateral view, possibly at the posteroinferior lateral left lung. The right lung is clear. No pneumothorax. No acute skeletal abnormality. IMPRESSION: 1. Minimal blunting of the left costophrenic angle may represent a small pleural effusion or pleural thickening. 2. Minimal posterior inferior lung linear density on lateral view is new from 07/25/2019 and may represent atelectasis. Electronically Signed   By: Neita Garnet M.D.   On: 03/24/2023 12:03    Procedures Procedures    Medications Ordered in ED Medications  famotidine (PEPCID) tablet 20 mg (20 mg Oral Given 03/24/23 1153)  alum & mag hydroxide-simeth (MAALOX/MYLANTA) 200-200-20 MG/5ML suspension 30 mL (30 mLs Oral Given 03/24/23 1153)    ED Course/ Medical Decision Making/ A&P Clinical Course as of 03/24/23 1912  Tue Mar 24, 2023  1325 Patient was reassessed and she has had no improvement of her epigastric pleuritic discomfort with the medications given.  We discussed her x-ray findings which had some concern for potential atelectasis versus small effusion or  infiltrate in the left lower lobe.  I think it is reasonable to treat at this point for pneumonia given the patient is having pleuritic pain on the epigastric and left side of her chest.  We will start her on cefdinir, avoiding Augmentin because she already has significant stomach upset issues.  I would advise that she follow-up with her gynecologist if she is not improving.  [MT]    Clinical Course User Index [MT] Hayleigh Bawa, Kermit Balo, MD                                 Medical Decision Making Amount and/or Complexity of Data Reviewed Labs: ordered. Radiology: ordered.  Risk OTC drugs. Prescription drug management.   This patient presents to the ED with concern for pleuritic epigastric discomfort. This involves an extensive number of treatment options, and is a complaint that carries with it a high risk of complications  and morbidity.  The differential diagnosis includes Stratus versus esophagitis versus pneumonia versus acute biliary disease versus pancreatitis versus other  Co-morbidities that complicate the patient evaluation: History of pregnancy at high risk of recurring nausea and vomiting, high risk of gastritis and esophagitis  Additional history obtained from the patient's boyfriend at the bedside  I ordered and personally interpreted labs.  The pertinent results include:  no emergent findings  I ordered imaging studies including x-ray of the chest, right upper quadrant ultrasound I independently visualized and interpreted imaging which showed left lower lobe effusion/infiltrate or atelectasis, no emergent gallbladder findings I agree with the radiologist interpretation  The patient was maintained on a cardiac monitor.  I personally viewed and interpreted the cardiac monitored which showed an underlying rhythm of: NSR  Per my interpretation the patient's ECG shows sinus rhythm no acute ischemic findings.  I have a low suspicion for ACS, aortic dissection, pericarditis, or other  emergent cardiac process.  Her chest pain is atypical for this, she has a normal and nonischemic ecg, and no other cardiac risk factors.  I ordered medication including Pepcid Maalox for suspected gastritis.  We are attempting a single dose here in the ED to see if the patient has relief of her symptoms, in terms of helping with differentiation.  These are both pregnancy class B medications.  I have reviewed the patients home medicines and have made adjustments as needed  Test Considered: Given that the patient's symptoms are intermittent and she has no other risk factors for PE, I have a lower suspicion for acute PE, and I do not see an indication for CT angiogram at this time.  After the interventions noted above, I reevaluated the patient and found that they have: stayed the same   Dispostion:  After consideration of the diagnostic results and the patients response to treatment, I feel that the patent would benefit from outpatient follow up.     Final Clinical Impression(s) / ED Diagnoses Final diagnoses:  Chest pain, unspecified type  Left lower lobe pulmonary infiltrate    Rx / DC Orders ED Discharge Orders          Ordered    cefdinir (OMNICEF) 300 MG capsule  2 times daily        03/24/23 1356              Terald Sleeper, MD 03/24/23 1912    Terald Sleeper, MD 03/24/23 [phone]

## 2023-04-08 ENCOUNTER — Encounter: Payer: Self-pay | Admitting: *Deleted

## 2023-04-08 DIAGNOSIS — O3430 Maternal care for cervical incompetence, unspecified trimester: Secondary | ICD-10-CM | POA: Insufficient documentation

## 2023-04-08 DIAGNOSIS — D259 Leiomyoma of uterus, unspecified: Secondary | ICD-10-CM | POA: Insufficient documentation

## 2023-04-08 HISTORY — DX: Maternal care for cervical incompetence, unspecified trimester: O34.30

## 2023-04-15 ENCOUNTER — Encounter: Payer: Self-pay | Admitting: *Deleted

## 2023-04-15 ENCOUNTER — Ambulatory Visit: Payer: BC Managed Care – PPO | Attending: Obstetrics and Gynecology

## 2023-04-15 ENCOUNTER — Other Ambulatory Visit: Payer: Self-pay | Admitting: *Deleted

## 2023-04-15 ENCOUNTER — Other Ambulatory Visit: Payer: Self-pay | Admitting: Obstetrics and Gynecology

## 2023-04-15 ENCOUNTER — Ambulatory Visit: Payer: BC Managed Care – PPO | Admitting: *Deleted

## 2023-04-15 VITALS — BP 103/59 | HR 87

## 2023-04-15 DIAGNOSIS — O4403 Placenta previa specified as without hemorrhage, third trimester: Secondary | ICD-10-CM | POA: Diagnosis present

## 2023-04-15 DIAGNOSIS — O09299 Supervision of pregnancy with other poor reproductive or obstetric history, unspecified trimester: Secondary | ICD-10-CM

## 2023-04-15 DIAGNOSIS — Z363 Encounter for antenatal screening for malformations: Secondary | ICD-10-CM

## 2023-04-15 DIAGNOSIS — O3430 Maternal care for cervical incompetence, unspecified trimester: Secondary | ICD-10-CM | POA: Insufficient documentation

## 2023-04-15 DIAGNOSIS — O4400 Placenta previa specified as without hemorrhage, unspecified trimester: Secondary | ICD-10-CM

## 2023-04-15 HISTORY — DX: Complete placenta previa nos or without hemorrhage, unspecified trimester: O44.00

## 2023-05-13 ENCOUNTER — Ambulatory Visit: Payer: BC Managed Care – PPO | Attending: Maternal & Fetal Medicine

## 2023-05-13 DIAGNOSIS — O4402 Placenta previa specified as without hemorrhage, second trimester: Secondary | ICD-10-CM | POA: Diagnosis not present

## 2023-05-13 DIAGNOSIS — O09292 Supervision of pregnancy with other poor reproductive or obstetric history, second trimester: Secondary | ICD-10-CM

## 2023-05-13 DIAGNOSIS — O35BXX0 Maternal care for other (suspected) fetal abnormality and damage, fetal cardiac anomalies, not applicable or unspecified: Secondary | ICD-10-CM

## 2023-05-13 DIAGNOSIS — O09299 Supervision of pregnancy with other poor reproductive or obstetric history, unspecified trimester: Secondary | ICD-10-CM | POA: Diagnosis present

## 2023-05-13 DIAGNOSIS — Z3A23 23 weeks gestation of pregnancy: Secondary | ICD-10-CM | POA: Diagnosis not present

## 2023-05-30 ENCOUNTER — Inpatient Hospital Stay (HOSPITAL_COMMUNITY)
Admission: AD | Admit: 2023-05-30 | Discharge: 2023-05-30 | Disposition: A | Discharge status: Home / Self Care | Payer: BC Managed Care – PPO | Attending: Obstetrics and Gynecology | Admitting: Obstetrics and Gynecology

## 2023-05-30 ENCOUNTER — Encounter (HOSPITAL_COMMUNITY): Payer: Self-pay | Admitting: Obstetrics and Gynecology

## 2023-05-30 DIAGNOSIS — Z3A25 25 weeks gestation of pregnancy: Secondary | ICD-10-CM | POA: Diagnosis not present

## 2023-05-30 DIAGNOSIS — O219 Vomiting of pregnancy, unspecified: Secondary | ICD-10-CM | POA: Insufficient documentation

## 2023-05-30 DIAGNOSIS — Z87891 Personal history of nicotine dependence: Secondary | ICD-10-CM | POA: Diagnosis not present

## 2023-05-30 LAB — URINALYSIS, ROUTINE W REFLEX MICROSCOPIC
Bilirubin Urine: NEGATIVE
Glucose, UA: NEGATIVE mg/dL
Hgb urine dipstick: NEGATIVE
Ketones, ur: 20 mg/dL — AB
Leukocytes,Ua: NEGATIVE
Nitrite: NEGATIVE
Protein, ur: 100 mg/dL — AB
Specific Gravity, Urine: 1.027 (ref 1.005–1.030)
pH: 8 (ref 5.0–8.0)

## 2023-05-30 MED ORDER — SCOPOLAMINE 1 MG/3DAYS TD PT72
1.0000 | MEDICATED_PATCH | TRANSDERMAL | Status: DC
  Administered 2023-05-30: 1.5 mg via TRANSDERMAL
  Filled 2023-05-30: qty 1

## 2023-05-30 MED ORDER — ONDANSETRON HCL 4 MG PO TABS: 8.0000 mg | ORAL_TABLET | Freq: Three times a day (TID) | ORAL | 0 refills | Status: DC | PRN

## 2023-05-30 MED ORDER — FAMOTIDINE 20 MG PO TABS
40.0000 mg | ORAL_TABLET | Freq: Once | ORAL | Status: AC
  Administered 2023-05-30: 40 mg via ORAL
  Filled 2023-05-30: qty 2

## 2023-05-30 MED ORDER — FAMOTIDINE 20 MG PO TABS: 40.0000 mg | ORAL_TABLET | Freq: Two times a day (BID) | ORAL | 0 refills | Status: DC

## 2023-05-30 MED ORDER — ONDANSETRON 4 MG PO TBDP
8.0000 mg | ORAL_TABLET | Freq: Once | ORAL | Status: AC
  Administered 2023-05-30: 8 mg via ORAL
  Filled 2023-05-30: qty 2

## 2023-05-30 MED ORDER — SCOPOLAMINE 1 MG/3DAYS TD PT72: 1.0000 | MEDICATED_PATCH | TRANSDERMAL | 0 refills | Status: DC

## 2023-05-30 NOTE — MAU Note (Signed)
..  Dawn Zamora is a 32 y.o. at [redacted]w[redacted]d here in MAU reporting: for the last week her nausea and vomiting has gotten worse. They switched her from diclegis to zofran (4mg ) and that wasn't working, so she switched back to diclegis. Has been unable to keep anything down for the last 24hrs. States earlier in her pregnancy they prescribed her phenergan and she started having chest pain so they d/c'd it. Denies VB or LOF. +FM. States she has soreness in her abdomen from throwing up, but no other pain.   Pain score: 3 Vitals:   05/30/23 1256  BP: (!) 97/57  Pulse: 71  Resp: 14  Temp: 98.5 F (36.9 C)  SpO2: 100%     FHT:158 Lab orders placed from triage:   UA

## 2023-05-30 NOTE — Discharge Instructions (Signed)

## 2023-05-30 NOTE — MAU Provider Note (Signed)
History     CSN: [id number]  Arrival date and time: 05/30/23 1229   Event Date/Time   First Provider Initiated Contact with Patient 05/30/23 1336      Chief Complaint  Patient presents with   Nausea   Emesis   Emesis  Pertinent negatives include no abdominal pain, chest pain, chills, coughing, diarrhea, dizziness, fever or headaches.    OB History     Gravida  3   Para  1   Term  1   Preterm      AB  1   Living  1      SAB      IAB  1   Ectopic      Multiple      Live Births  1           Past Medical History:  Diagnosis Date   Anemia    Anxiety    Asthma    Dysrhythmia    Generalized headaches    Hx of chlamydia infection    Normal labor 01/06/2014   NSVD (normal spontaneous vaginal delivery) 01/07/2014    Past Surgical History:  Procedure Laterality Date   NO PAST SURGERIES      Family History  Problem Relation Age of Onset   Polydactyly Paternal Uncle    Sickle cell anemia Cousin    Diabetes Mother    Diabetes Maternal Aunt    Diabetes Maternal Grandmother    Diabetes Maternal Grandfather     Social History   Tobacco Use   Smoking status: Former    Current packs/day: 0.00    Types: Cigarettes    Quit date: 10/20/2010    Years since quitting: 12.6   Smokeless tobacco: Never  Vaping Use   Vaping status: Never Used  Substance Use Topics   Alcohol use: Not Currently   Drug use: No    Allergies:  Allergies  Allergen Reactions   Asa [Aspirin] Shortness Of Breath    Respiratory distress   Coconut (Cocos Nucifera) Anaphylaxis   Lemon Oil Hives   Lemon Balm [Melissa Officinalis] Hives   Metronidazole Diarrhea and Nausea And Vomiting   Phenergan [Promethazine] Other (See Comments)    Chest pain   Copper-Containing Compounds Rash   Nickel Rash    Medications Prior to Admission  Medication Sig Dispense Refill Last Dose   metroNIDAZOLE (METROGEL) 0.75 % vaginal gel Place 1 Applicatorful vaginally 2 (two) times daily.    Past Week   Prenatal Vit-Fe Fumarate-FA (PRENATAL PO) Take 1 tablet by mouth daily.   05/30/2023   [DISCONTINUED] ondansetron (ZOFRAN) 4 MG tablet Take 4 mg by mouth every 8 (eight) hours as needed for nausea or vomiting.   Past Week   Cholecalciferol (VITAMIN D3) 50 MCG (2000 UT) capsule Take 4,000 Units by mouth daily. (Patient not taking: Reported on 04/15/2023)      Doxylamine-Pyridoxine 10-10 MG TBEC Take 1 tablet by mouth 2 (two) times daily.   Unknown    Review of Systems  Constitutional:  Positive for appetite change and unexpected weight change. Negative for chills, fatigue and fever.  Respiratory:  Negative for cough and shortness of breath.   Cardiovascular:  Negative for chest pain and palpitations.  Gastrointestinal:  Positive for nausea and vomiting. Negative for abdominal pain, constipation and diarrhea.  Genitourinary:  Negative for difficulty urinating, flank pain, frequency and urgency.  Neurological:  Negative for dizziness and headaches.  Psychiatric/Behavioral:  Negative for confusion and suicidal ideas.  Physical Exam   Blood pressure (!) 100/56, pulse 65, temperature 98.5 F (36.9 C), resp. rate 14, weight 65.3 kg, last menstrual period 11/30/2022, SpO2 100%, unknown if currently breastfeeding.  Physical Exam Vitals and nursing note reviewed.  Constitutional:      Appearance: Normal appearance.  Cardiovascular:     Rate and Rhythm: Normal rate and regular rhythm.     Pulses: Normal pulses.  Pulmonary:     Effort: Pulmonary effort is normal. No respiratory distress.  Neurological:     Mental Status: She is alert.  Psychiatric:        Mood and Affect: Mood normal.        Thought Content: Thought content normal.        Judgment: Judgment normal.     Fetal Assessment 145 bpm, Mod Var, -Decels, +Accels  Toco: UI  MAU Course   Results for orders placed or performed during the hospital encounter of 05/30/23 (from the past 24 hour(s))  Urinalysis, Routine  w reflex microscopic -Urine, Clean Catch     Status: Abnormal   Collection Time: 05/30/23  1:37 PM  Result Value Ref Range   Color, Urine YELLOW YELLOW   APPearance HAZY (A) CLEAR   Specific Gravity, Urine 1.027 1.005 - 1.030   pH 8.0 5.0 - 8.0   Glucose, UA NEGATIVE NEGATIVE mg/dL   Hgb urine dipstick NEGATIVE NEGATIVE   Bilirubin Urine NEGATIVE NEGATIVE   Ketones, ur 20 (A) NEGATIVE mg/dL   Protein, ur 409 (A) NEGATIVE mg/dL   Nitrite NEGATIVE NEGATIVE   Leukocytes,Ua NEGATIVE NEGATIVE   RBC / HPF 0-5 0 - 5 RBC/hpf   WBC, UA 0-5 0 - 5 WBC/hpf   Bacteria, UA RARE (A) NONE SEEN   Squamous Epithelial / HPF 0-5 0 - 5 /HPF   Mucus PRESENT    No results found.  MDM PE: Benign  Labs: UA, benign  EFM: Cat 1 Medications prescribed   Assessment and Plan  32yo G3P1  SIUP at 25.3weeks Cat 1 FT  - Exam findings discussed. - Lab results as above - Prescriptions for antiemetics sent. Reports improvement following medications administered today.  - Follow up with [facility] as scheduled. - Precautions reviewed.  - Patient may return to MAU as needed.  - Patient reports  understanding of discussion.    Richardson Landry MSN, CNM 05/30/2023, 3:51 PM

## 2023-06-17 ENCOUNTER — Other Ambulatory Visit: Payer: Self-pay | Admitting: Maternal & Fetal Medicine

## 2023-06-17 ENCOUNTER — Other Ambulatory Visit: Payer: Self-pay

## 2023-06-17 ENCOUNTER — Ambulatory Visit: Payer: BC Managed Care – PPO | Attending: Maternal & Fetal Medicine

## 2023-06-17 DIAGNOSIS — O09293 Supervision of pregnancy with other poor reproductive or obstetric history, third trimester: Secondary | ICD-10-CM | POA: Diagnosis not present

## 2023-06-17 DIAGNOSIS — Z3A28 28 weeks gestation of pregnancy: Secondary | ICD-10-CM

## 2023-06-17 DIAGNOSIS — O35BXX0 Maternal care for other (suspected) fetal abnormality and damage, fetal cardiac anomalies, not applicable or unspecified: Secondary | ICD-10-CM | POA: Diagnosis not present

## 2023-06-17 DIAGNOSIS — O4403 Placenta previa specified as without hemorrhage, third trimester: Secondary | ICD-10-CM

## 2023-06-29 ENCOUNTER — Inpatient Hospital Stay (HOSPITAL_COMMUNITY): Payer: BC Managed Care – PPO

## 2023-06-29 ENCOUNTER — Other Ambulatory Visit: Payer: Self-pay

## 2023-06-29 ENCOUNTER — Encounter (HOSPITAL_COMMUNITY): Payer: Self-pay | Admitting: Obstetrics and Gynecology

## 2023-06-29 ENCOUNTER — Inpatient Hospital Stay (HOSPITAL_COMMUNITY)
Admission: AD | Admit: 2023-06-29 | Discharge: 2023-06-29 | Disposition: A | Discharge status: Home / Self Care | Payer: BC Managed Care – PPO | Attending: Obstetrics and Gynecology | Admitting: Obstetrics and Gynecology

## 2023-06-29 DIAGNOSIS — O9A212 Injury, poisoning and certain other consequences of external causes complicating pregnancy, second trimester: Secondary | ICD-10-CM | POA: Insufficient documentation

## 2023-06-29 DIAGNOSIS — Z3A29 29 weeks gestation of pregnancy: Secondary | ICD-10-CM | POA: Insufficient documentation

## 2023-06-29 DIAGNOSIS — M25562 Pain in left knee: Secondary | ICD-10-CM | POA: Insufficient documentation

## 2023-06-29 DIAGNOSIS — M25521 Pain in right elbow: Secondary | ICD-10-CM | POA: Insufficient documentation

## 2023-06-29 DIAGNOSIS — O9A213 Injury, poisoning and certain other consequences of external causes complicating pregnancy, third trimester: Secondary | ICD-10-CM

## 2023-06-29 DIAGNOSIS — W109XXA Fall (on) (from) unspecified stairs and steps, initial encounter: Secondary | ICD-10-CM

## 2023-06-29 DIAGNOSIS — M5489 Other dorsalgia: Secondary | ICD-10-CM | POA: Insufficient documentation

## 2023-06-29 MED ORDER — CYCLOBENZAPRINE HCL 5 MG PO TABS
10.0000 mg | ORAL_TABLET | Freq: Once | ORAL | Status: AC
  Administered 2023-06-29: 10 mg via ORAL
  Filled 2023-06-29: qty 2

## 2023-06-29 MED ORDER — CYCLOBENZAPRINE HCL 10 MG PO TABS: 10.0000 mg | ORAL_TABLET | Freq: Two times a day (BID) | ORAL | 0 refills | Status: DC | PRN

## 2023-06-29 NOTE — Discharge Instructions (Signed)
Return to MAU: If you have heavy bleeding that soaks through more that 2 pads per hour for an hour or more If you bleed so much that you feel like you might pass out or you do pass out If you have significant abdominal pain that is not improved with Tylenol 1000 mg every 8 hours as needed for pain If you develop a fever > 100.5

## 2023-06-29 NOTE — MAU Note (Addendum)
Dawn Zamora is a 32 y.o. at [redacted]w[redacted]d here in MAU reporting: Pt reports she fell down the stairs at 11:15pm on her back and bottom. Reports pain in her left knee, right elbow, and upper back. Endorses +FM, denies VB and LOF.    Onset of complaint: 11:15pm  Pain score: Left Knee 6/10, Right elbow 4/10, back 6/10 Vitals:   06/29/23 0029  BP: (!) 98/54  Pulse: 75  Resp: 16  Temp: 98.5 F (36.9 C)     FHT:143 Lab orders placed from triage:   none

## 2023-06-29 NOTE — MAU Provider Note (Signed)
History     CSN: [id number]  Arrival date and time: 06/29/23 0001   Event Date/Time   First Provider Initiated Contact with Patient 06/29/23 0046      Chief Complaint  Patient presents with   Fall   HPI [name] is a 32 y.o. year old G61P1011 female at [redacted]w[redacted]d weeks gestation who presents to MAU reporting she fell down the stairs at 2315 hitting her back and buttocks. She now reports pain in her upper back (6/10), LT knee (6/10) and RT elbow (4/10). She reports (+) FM. She denies VB or LOF.  She receives [facility] with [facility]; next appt is 07/02/2023. Her partner is present and contributing to the history taking.   OB History     Gravida  3   Para  1   Term  1   Preterm      AB  1   Living  1      SAB      IAB  1   Ectopic      Multiple      Live Births  1           Past Medical History:  Diagnosis Date   Anemia    Anxiety    Asthma    Generalized headaches    Hx of chlamydia infection    Normal labor 01/06/2014   NSVD (normal spontaneous vaginal delivery) 01/07/2014    Past Surgical History:  Procedure Laterality Date   NO PAST SURGERIES      Family History  Problem Relation Age of Onset   Polydactyly Paternal Uncle    Sickle cell anemia Cousin    Diabetes Mother    Diabetes Maternal Aunt    Diabetes Maternal Grandmother    Diabetes Maternal Grandfather     Social History   Tobacco Use   Smoking status: Former    Current packs/day: 0.00    Types: Cigarettes    Quit date: 10/20/2010    Years since quitting: 12.6   Smokeless tobacco: Never  Vaping Use   Vaping status: Never Used  Substance Use Topics   Alcohol use: Not Currently   Drug use: No    Allergies:  Allergies  Allergen Reactions   Asa [Aspirin] Shortness Of Breath    Respiratory distress   Coconut (Cocos Nucifera) Anaphylaxis   Lemon Oil Hives   Lemon Balm [Melissa Officinalis] Hives   Metronidazole Diarrhea and Nausea And Vomiting    Phenergan [Promethazine] Other (See Comments)    Chest pain   Copper-Containing Compounds Rash   Nickel Rash    Medications Prior to Admission  Medication Sig Dispense Refill Last Dose   ondansetron (ZOFRAN) 4 MG tablet Take 2 tablets (8 mg total) by mouth every 8 (eight) hours as needed for nausea or vomiting. 80 tablet 0 06/28/2023 at 1830   Prenatal Vit-Fe Fumarate-FA (PRENATAL PO) Take 1 tablet by mouth daily.   06/28/2023 at 0900   famotidine (PEPCID) 20 MG tablet Take 2 tablets (40 mg total) by mouth 2 (two) times daily. 80 tablet 0    metroNIDAZOLE (METROGEL) 0.75 % vaginal gel Place 1 Applicatorful vaginally 2 (two) times daily.      scopolamine (TRANSDERM-SCOP) 1 MG/3DAYS Place 1 patch (1.5 mg total) onto the skin every 3 (three) days. 10 patch 0     Review of Systems  Constitutional: Negative.   HENT: Negative.    Eyes: Negative.   Cardiovascular: Negative.   Gastrointestinal: Negative.  Endocrine: Negative.   Musculoskeletal:  Positive for back pain (upper).       LT knee and RT elbow pain  Skin: Negative.   Allergic/Immunologic: Negative.   Neurological: Negative.   Hematological: Negative.   Psychiatric/Behavioral: Negative.     Physical Exam   Blood pressure (!) 98/54, pulse 75, temperature 98.5 F (36.9 C), temperature source Oral, resp. rate 16, last menstrual period 11/30/2022, unknown if currently breastfeeding.  Physical Exam Vitals and nursing note reviewed.  Constitutional:      Appearance: Normal appearance. She is normal weight.  HENT:     Head: Normocephalic and atraumatic.  Cardiovascular:     Rate and Rhythm: Normal rate.  Pulmonary:     Effort: Pulmonary effort is normal.  Abdominal:     Palpations: Abdomen is soft.  Genitourinary:    Comments: deferred Musculoskeletal:        General: Tenderness (of LT knee) present. No deformity. Normal range of motion.  Skin:    General: Skin is warm and dry.  Neurological:     Mental Status: She is  alert and oriented to person, place, and time.  Psychiatric:        Mood and Affect: Mood normal.        Behavior: Behavior normal.        Thought Content: Thought content normal.        Judgment: Judgment normal.    REACTIVE NST - FHR: 130 bpm / moderate variability / accels present / decels absent / TOCO: UI noted MAU Course  Procedures  MDM OB MFM Limited U/S LT Knee XRay RT Elbow XRay Flexeril 10 mg po -- relieved pain in back, but knee and elbow remain in pain Ice to LT knee and RT elbow    DG Elbow 2 Views Right  Result Date: 06/29/2023 CLINICAL DATA:  Status post fall. EXAM: RIGHT ELBOW - 2 VIEW COMPARISON:  None Available. FINDINGS: There is no evidence of fracture, dislocation, or joint effusion. There is no evidence of arthropathy or other focal bone abnormality. Soft tissues are unremarkable. IMPRESSION: Negative. Electronically Signed   By: Aram Candela M.D.   On: 06/29/2023 02:00   DG Knee 2 Views Left  Result Date: 06/29/2023 CLINICAL DATA:  Status post fall. EXAM: LEFT KNEE - 1-2 VIEW COMPARISON:  June 30, 2019 FINDINGS: No evidence of fracture, dislocation, or joint effusion. No evidence of arthropathy or other focal bone abnormality. Soft tissues are unremarkable. IMPRESSION: Negative. Electronically Signed   By: Aram Candela M.D.   On: 06/29/2023 01:59    Assessment and Plan  1. Traumatic injury during pregnancy in third trimester - Information provided on preventing injuries in pregnancy and fetal kick counts  - Return to MAU: If you have heavy bleeding that soaks through more that 2 pads per hour for an hour or more If you bleed so much that you feel like you might pass out or you do pass out If you have significant abdominal pain that is not improved with Tylenol 1000 mg every 8 hours as needed for pain If you develop a fever > 100.5   2. [redacted] weeks gestation of pregnancy   - Discharge patient - Keep scheduled appt with [facility] on Thursday  07/02/2023 - Patient verbalized an understanding of the plan of care and agrees.   Raelyn Mora, CNM 06/29/2023, 12:46 AM

## 2023-08-03 ENCOUNTER — Encounter (HOSPITAL_COMMUNITY): Payer: Self-pay | Admitting: Obstetrics and Gynecology

## 2023-08-03 ENCOUNTER — Inpatient Hospital Stay (HOSPITAL_COMMUNITY)
Admission: AD | Admit: 2023-08-03 | Discharge: 2023-08-03 | Disposition: A | Discharge status: Home / Self Care | Payer: BC Managed Care – PPO | Attending: Obstetrics and Gynecology | Admitting: Obstetrics and Gynecology

## 2023-08-03 DIAGNOSIS — O26893 Other specified pregnancy related conditions, third trimester: Secondary | ICD-10-CM | POA: Insufficient documentation

## 2023-08-03 DIAGNOSIS — Z3A34 34 weeks gestation of pregnancy: Secondary | ICD-10-CM | POA: Diagnosis not present

## 2023-08-03 DIAGNOSIS — N898 Other specified noninflammatory disorders of vagina: Secondary | ICD-10-CM | POA: Diagnosis not present

## 2023-08-03 DIAGNOSIS — O3433 Maternal care for cervical incompetence, third trimester: Secondary | ICD-10-CM | POA: Insufficient documentation

## 2023-08-03 DIAGNOSIS — O4703 False labor before 37 completed weeks of gestation, third trimester: Secondary | ICD-10-CM | POA: Insufficient documentation

## 2023-08-03 DIAGNOSIS — O42913 Preterm premature rupture of membranes, unspecified as to length of time between rupture and onset of labor, third trimester: Secondary | ICD-10-CM | POA: Diagnosis not present

## 2023-08-03 DIAGNOSIS — O99513 Diseases of the respiratory system complicating pregnancy, third trimester: Secondary | ICD-10-CM | POA: Insufficient documentation

## 2023-08-03 LAB — URINALYSIS, ROUTINE W REFLEX MICROSCOPIC
Bilirubin Urine: NEGATIVE
Glucose, UA: NEGATIVE mg/dL
Hgb urine dipstick: NEGATIVE
Ketones, ur: NEGATIVE mg/dL
Nitrite: NEGATIVE
Protein, ur: NEGATIVE mg/dL
Specific Gravity, Urine: 1.009 (ref 1.005–1.030)
pH: 8 (ref 5.0–8.0)

## 2023-08-03 LAB — WET PREP, GENITAL
Clue Cells Wet Prep HPF POC: NONE SEEN
Sperm: NONE SEEN
Trich, Wet Prep: NONE SEEN
WBC, Wet Prep HPF POC: >=10 — AB (ref <10)
Yeast Wet Prep HPF POC: NONE SEEN

## 2023-08-03 LAB — RUPTURE OF MEMBRANE (ROM)PLUS: Rom Plus: NEGATIVE

## 2023-08-03 MED ORDER — NIFEDIPINE 10 MG PO CAPS
10.0000 mg | ORAL_CAPSULE | ORAL | Status: DC | PRN
  Administered 2023-08-03: 10 mg via ORAL
  Filled 2023-08-03 (×2): qty 1

## 2023-08-03 NOTE — MAU Provider Note (Signed)
Chief Complaint:  Rupture of Membranes, Contractions, and Pelvic Pain   Event Date/Time   First Provider Initiated Contact with Patient 08/03/23 2007     HPI: Dawn Zamora is a 32 y.o. G3P1011 at 27w5dwho presents to maternity admissions reporting small gushes of fluid and pelvic cramping and pressure.  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 She reports good fetal movement, denies vaginal bleeding, vaginal itching/burning, urinary symptoms, or fever/chills.    Pelvic Pain The patient's primary symptoms include pelvic pain and vaginal discharge. The patient's pertinent negatives include no vaginal bleeding. This is a new problem. The current episode started today. She is pregnant. Associated symptoms include abdominal pain (more pressure than pain). Pertinent negatives include no chills, constipation, diarrhea, dysuria or fever. The vaginal discharge was clear. There has been no bleeding. She has not been passing clots. She has not been passing tissue. Nothing aggravates the symptoms. She has tried nothing for the symptoms.   RN Note: Dawn Zamora is a 33 y.o. at [redacted]w[redacted]d here in MAU reporting: hx of cervical incompetence with this pregnancy. States she called her OB office earlier because she is having intermittent contractions and an increase in pelvic pressure. Also reports she's been having small gushes of watery/white fluid since Thursday and unsure if her water is broken. Denies VB. +FM.   Pain score: contractions-4    pelvic pressure- 8  Past Medical History: Past Medical History:  Diagnosis Date   Anemia    Anxiety    Asthma    Generalized headaches    Hx of chlamydia infection    Normal labor 01/06/2014   NSVD (normal spontaneous vaginal delivery) 01/07/2014    Past obstetric history: OB History  Gravida Para Term Preterm AB Living  3 1 1  1 1   SAB IAB Ectopic Multiple Live Births   1   1    # Outcome Date GA Lbr Len/2nd  Weight Sex Type Anes PTL Lv  3 Current           2 Term 01/07/14 [redacted]w[redacted]d 01:33 / 00:30 3340 g M Vag-Spont EPI  LIV  1 IAB 2009            Past Surgical History: Past Surgical History:  Procedure Laterality Date   NO PAST SURGERIES      Family History: Family History  Problem Relation Age of Onset   Polydactyly Paternal Uncle    Sickle cell anemia Cousin    Diabetes Mother    Diabetes Maternal Aunt    Diabetes Maternal Grandmother    Diabetes Maternal Grandfather     Social History: Social History   Tobacco Use   Smoking status: Former    Current packs/day: 0.00    Types: Cigarettes    Quit date: 10/20/2010    Years since quitting: 12.7   Smokeless tobacco: Never  Vaping Use   Vaping status: Never Used  Substance Use Topics   Alcohol use: Not Currently   Drug use: No    Allergies:  Allergies  Allergen Reactions   Asa [Aspirin] Shortness Of Breath    Respiratory distress   Coconut (Cocos Nucifera) Anaphylaxis   Lemon Oil Hives   Lemon Balm [Melissa Officinalis] Hives   Metronidazole Diarrhea and Nausea And Vomiting   Phenergan [Promethazine] Other (See Comments)    Chest pain   Copper-Containing Compounds Rash   Nickel Rash    Meds:  Medications Prior to Admission  Medication  Sig Dispense Refill Last Dose/Taking   famotidine (PEPCID) 20 MG tablet Take 2 tablets (40 mg total) by mouth 2 (two) times daily. 80 tablet 0 08/02/2023   Prenatal Vit-Fe Fumarate-FA (PRENATAL PO) Take 1 tablet by mouth daily.   08/02/2023   cyclobenzaprine (FLEXERIL) 10 MG tablet Take 1 tablet (10 mg total) by mouth 2 (two) times daily as needed. 20 tablet 0    metroNIDAZOLE (METROGEL) 0.75 % vaginal gel Place 1 Applicatorful vaginally 2 (two) times daily.      ondansetron (ZOFRAN) 4 MG tablet Take 2 tablets (8 mg total) by mouth every 8 (eight) hours as needed for nausea or vomiting. 80 tablet 0    scopolamine (TRANSDERM-SCOP) 1 MG/3DAYS Place 1 patch (1.5 mg total) onto the skin every  3 (three) days. 10 patch 0     I have reviewed patient's Past Medical Hx, Surgical Hx, Family Hx, Social Hx, medications and allergies.   ROS:  Review of Systems  Constitutional:  Negative for chills and fever.  Gastrointestinal:  Positive for abdominal pain (more pressure than pain). Negative for constipation and diarrhea.  Genitourinary:  Positive for pelvic pain and vaginal discharge. Negative for dysuria.   Other systems negative  Physical Exam  Patient Vitals for the past 24 hrs:  BP Temp Temp src Pulse Resp SpO2  08/03/23 1934 -- -- -- -- -- 100 %  08/03/23 1933 107/60 -- -- 91 16 --  08/03/23 1929 -- -- -- -- -- 99 %  08/03/23 1806 107/64 98.3 F (36.8 C) Oral 87 14 --   Constitutional: Well-developed, well-nourished female in no acute distress.  Cardiovascular: normal rate  Respiratory: normal effort,  GI: Abd soft, non-tender, gravid appropriate for gestational age.   No rebound or guarding. MS: Extremities nontender, no edema, normal ROM Neurologic: Alert and oriented x 4.  GU: Neg CVAT.  PELVIC EXAM:    Cervix closed/long/high  FHT:  Baseline 145-150 , moderate variability, accelerations present, no decelerations Contractions: q 2-4 mins Irregular     Labs: Results for orders placed or performed during the hospital encounter of 08/03/23 (from the past 24 hours)  Urinalysis, Routine w reflex microscopic -Urine, Clean Catch     Status: Abnormal   Collection Time: 08/03/23  6:11 PM  Result Value Ref Range   Color, Urine YELLOW YELLOW   APPearance HAZY (A) CLEAR   Specific Gravity, Urine 1.009 1.005 - 1.030   pH 8.0 5.0 - 8.0   Glucose, UA NEGATIVE NEGATIVE mg/dL   Hgb urine dipstick NEGATIVE NEGATIVE   Bilirubin Urine NEGATIVE NEGATIVE   Ketones, ur NEGATIVE NEGATIVE mg/dL   Protein, ur NEGATIVE NEGATIVE mg/dL   Nitrite NEGATIVE NEGATIVE   Leukocytes,Ua LARGE (A) NEGATIVE   RBC / HPF 0-5 0 - 5 RBC/hpf   WBC, UA 6-10 0 - 5 WBC/hpf   Bacteria, UA MANY (A)  NONE SEEN   Squamous Epithelial / HPF 6-10 0 - 5 /HPF   Mucus PRESENT   Rupture of Membrane (ROM) Plus     Status: None   Collection Time: 08/03/23  8:34 PM  Result Value Ref Range   Rom Plus NEGATIVE   Wet prep, genital     Status: Abnormal   Collection Time: 08/03/23  8:34 PM   Specimen: Vaginal  Result Value Ref Range   Yeast Wet Prep HPF POC NONE SEEN NONE SEEN   Trich, Wet Prep NONE SEEN NONE SEEN   Clue Cells Wet Prep HPF POC NONE SEEN  NONE SEEN   WBC, Wet Prep HPF POC >=10 (A) <10   Sperm NONE SEEN      Imaging:  No results found.  MAU Course/MDM: I have reviewed the triage vital signs and the nursing notes.   Pertinent labs & imaging results that were available during my care of the patient were reviewed by me and considered in my medical decision making (see chart for details).      I have reviewed her medical records including past results, notes and treatments.   I have ordered labs and reviewed results. ROM plus is negative NST reviewed  Treatments in MAU included EFM, Procardia series x 1 dose (patient stated she did not feel UCs anymore, irritability only on monitor, declined further doses).    Assessment: Single IUP at [redacted]w[redacted]d Threatened preterm labor Vaginal discharge, negative ROM+  Plan: Discharge home Preterm Labor precautions and fetal kick counts Follow up in Office for prenatal visits and recheck Encouraged to return if she develops worsening of symptoms, increase in pain, fever, or other concerning symptoms.   Pt stable at time of discharge.  Wynelle Bourgeois CNM, MSN Certified Nurse-Midwife 08/03/2023 8:07 PM

## 2023-08-03 NOTE — MAU Note (Signed)
..  Dawn Zamora is a 32 y.o. at [redacted]w[redacted]d here in MAU reporting: hx of cervical incompetence with this pregnancy. States she called her OB office earlier because she is having intermittent contractions and an increase in pelvic pressure. Also reports she's been having small gushes of watery/white fluid since Thursday and unsure if her water is broken. Denies VB. +FM.   Pain score: contractions-4    pelvic pressure- 8 Vitals:   08/03/23 1806  BP: 107/64  Pulse: 87  Resp: 14  Temp: 98.3 F (36.8 C)     FHT:146 Lab orders placed from triage:   fern

## 2023-08-03 NOTE — MAU Note (Signed)
Pt reports that she has felt 1 contraction in last 20 min and that her uterus feels more relaxed-declined 2nd dose of Procardia. Artelia Laroche CNM given  report.

## 2023-08-19 NOTE — L&D Delivery Note (Signed)
Delivery Note Labor onset: 09/04/2023 Labor Onset Time: 1445 Complete dilation at 5:31 PM Onset of pushing at 1737 FHR second stage Cat 1 Analgesia/Anesthesia intrapartum: unmedicated  Guided pushing with maternal urge. Delivery of a viable female at 68. Fetal head delivered in LOA position.  Nuchal cord: x1, loose.  Infant placed on maternal abd, dried, and tactile stim.  Cord double clamped after 5 min and cut by father, Augusto Gamble.  RN x2 present for birth.  Cord blood sample collected: yes Arterial cord blood sample collected: no  Placenta delivered Schultz side, intact, with 3 VC.  Placenta to L&D  Uterine tone firm, bleeding small with clots x2  No laceration identified.  QBL (mL): 250 Complications: none APGAR: APGAR (1 MIN): 8  APGAR (5 MINS): 9  APGAR (10 MINS):   Mom to postpartum.  Baby to Couplet care / Skin to Skin. Baby girl "name undecided"  Roma Schanz DNP, CNM 09/04/2023, 6:32 PM

## 2023-08-21 LAB — OB RESULTS CONSOLE GC/CHLAMYDIA
Chlamydia: NEGATIVE
Neisseria Gonorrhea: NEGATIVE

## 2023-08-24 LAB — OB RESULTS CONSOLE GBS: GBS: NEGATIVE

## 2023-09-04 ENCOUNTER — Encounter (HOSPITAL_COMMUNITY): Payer: Self-pay | Admitting: Obstetrics and Gynecology

## 2023-09-04 ENCOUNTER — Inpatient Hospital Stay (HOSPITAL_COMMUNITY)
Admission: AD | Admit: 2023-09-04 | Discharge: 2023-09-06 | DRG: 807 | Disposition: A | Discharge status: Home / Self Care | Payer: Medicaid Other | Attending: Obstetrics and Gynecology | Admitting: Obstetrics and Gynecology

## 2023-09-04 ENCOUNTER — Other Ambulatory Visit: Payer: Self-pay

## 2023-09-04 DIAGNOSIS — O9902 Anemia complicating childbirth: Secondary | ICD-10-CM | POA: Diagnosis present

## 2023-09-04 DIAGNOSIS — Z87891 Personal history of nicotine dependence: Secondary | ICD-10-CM

## 2023-09-04 DIAGNOSIS — O3413 Maternal care for benign tumor of corpus uteri, third trimester: Secondary | ICD-10-CM | POA: Diagnosis present

## 2023-09-04 DIAGNOSIS — D259 Leiomyoma of uterus, unspecified: Secondary | ICD-10-CM | POA: Diagnosis present

## 2023-09-04 DIAGNOSIS — Z833 Family history of diabetes mellitus: Secondary | ICD-10-CM

## 2023-09-04 DIAGNOSIS — D509 Iron deficiency anemia, unspecified: Secondary | ICD-10-CM | POA: Diagnosis present

## 2023-09-04 DIAGNOSIS — Z3A39 39 weeks gestation of pregnancy: Secondary | ICD-10-CM

## 2023-09-04 DIAGNOSIS — Z8659 Personal history of other mental and behavioral disorders: Secondary | ICD-10-CM

## 2023-09-04 DIAGNOSIS — O26893 Other specified pregnancy related conditions, third trimester: Secondary | ICD-10-CM | POA: Diagnosis present

## 2023-09-04 LAB — CBC
HCT: 27.4 % — ABNORMAL LOW (ref 36.0–46.0)
Hemoglobin: 8.8 g/dL — ABNORMAL LOW (ref 12.0–15.0)
MCH: 25.1 pg — ABNORMAL LOW (ref 26.0–34.0)
MCHC: 32.1 g/dL (ref 30.0–36.0)
MCV: 78.1 fL — ABNORMAL LOW (ref 80.0–100.0)
Platelets: 313 10*3/uL (ref 150–400)
RBC: 3.51 MIL/uL — ABNORMAL LOW (ref 3.87–5.11)
RDW: 15.2 % (ref 11.5–15.5)
WBC: 8.5 10*3/uL (ref 4.0–10.5)
nRBC: 0 % (ref 0.0–0.2)

## 2023-09-04 LAB — TYPE AND SCREEN
ABO/RH(D): AB POS
Antibody Screen: NEGATIVE

## 2023-09-04 MED ORDER — TERBUTALINE SULFATE 1 MG/ML IJ SOLN: 0.2500 mg | Freq: Once | INTRAMUSCULAR | Status: DC | PRN

## 2023-09-04 MED ORDER — LACTATED RINGERS IV SOLN: 500.0000 mL | Freq: Once | INTRAVENOUS | Status: DC

## 2023-09-04 MED ORDER — SENNOSIDES-DOCUSATE SODIUM 8.6-50 MG PO TABS
2.0000 | ORAL_TABLET | ORAL | Status: DC
  Administered 2023-09-05 – 2023-09-06 (×2): 2 via ORAL
  Filled 2023-09-04 (×2): qty 2

## 2023-09-04 MED ORDER — DIBUCAINE (PERIANAL) 1 % EX OINT: 1.0000 | TOPICAL_OINTMENT | CUTANEOUS | Status: DC | PRN

## 2023-09-04 MED ORDER — WITCH HAZEL-GLYCERIN EX PADS: 1.0000 | MEDICATED_PAD | CUTANEOUS | Status: DC | PRN

## 2023-09-04 MED ORDER — LACTATED RINGERS IV SOLN: 500.0000 mL | INTRAVENOUS | Status: DC | PRN

## 2023-09-04 MED ORDER — EPHEDRINE 5 MG/ML INJ: 10.0000 mg | INTRAVENOUS | Status: DC | PRN

## 2023-09-04 MED ORDER — OXYTOCIN BOLUS FROM INFUSION
333.0000 mL | Freq: Once | INTRAVENOUS | Status: AC
  Administered 2023-09-04: 333 mL via INTRAVENOUS

## 2023-09-04 MED ORDER — DIPHENHYDRAMINE HCL 25 MG PO CAPS: 25.0000 mg | ORAL_CAPSULE | Freq: Four times a day (QID) | ORAL | Status: DC | PRN

## 2023-09-04 MED ORDER — OXYTOCIN-SODIUM CHLORIDE 30-0.9 UT/500ML-% IV SOLN
1.0000 m[IU]/min | INTRAVENOUS | Status: DC
  Filled 2023-09-04: qty 500

## 2023-09-04 MED ORDER — PHENYLEPHRINE 80 MCG/ML (10ML) SYRINGE FOR IV PUSH (FOR BLOOD PRESSURE SUPPORT): 80.0000 ug | PREFILLED_SYRINGE | INTRAVENOUS | Status: DC | PRN

## 2023-09-04 MED ORDER — LACTATED RINGERS IV SOLN: INTRAVENOUS | Status: DC

## 2023-09-04 MED ORDER — SIMETHICONE 80 MG PO CHEW: 80.0000 mg | CHEWABLE_TABLET | ORAL | Status: DC | PRN

## 2023-09-04 MED ORDER — IBUPROFEN 600 MG PO TABS
600.0000 mg | ORAL_TABLET | Freq: Four times a day (QID) | ORAL | Status: DC
  Administered 2023-09-04 – 2023-09-06 (×6): 600 mg via ORAL
  Filled 2023-09-04 (×6): qty 1

## 2023-09-04 MED ORDER — TETANUS-DIPHTH-ACELL PERTUSSIS 5-2.5-18.5 LF-MCG/0.5 IM SUSY: 0.5000 mL | PREFILLED_SYRINGE | Freq: Once | INTRAMUSCULAR | Status: DC

## 2023-09-04 MED ORDER — SOD CITRATE-CITRIC ACID 500-334 MG/5ML PO SOLN
30.0000 mL | ORAL | Status: DC | PRN
  Administered 2023-09-04: 30 mL via ORAL
  Filled 2023-09-04: qty 30

## 2023-09-04 MED ORDER — ONDANSETRON HCL 4 MG PO TABS: 4.0000 mg | ORAL_TABLET | ORAL | Status: DC | PRN

## 2023-09-04 MED ORDER — FENTANYL-BUPIVACAINE-NACL 0.5-0.125-0.9 MG/250ML-% EP SOLN: 12.0000 mL/h | EPIDURAL | Status: DC | PRN

## 2023-09-04 MED ORDER — FENTANYL CITRATE (PF) 100 MCG/2ML IJ SOLN: 50.0000 ug | INTRAMUSCULAR | Status: DC | PRN

## 2023-09-04 MED ORDER — ACETAMINOPHEN 325 MG PO TABS
650.0000 mg | ORAL_TABLET | Freq: Four times a day (QID) | ORAL | Status: DC
  Administered 2023-09-05 – 2023-09-06 (×5): 650 mg via ORAL
  Filled 2023-09-04 (×6): qty 2

## 2023-09-04 MED ORDER — ACETAMINOPHEN 325 MG PO TABS
650.0000 mg | ORAL_TABLET | ORAL | Status: DC | PRN
Start: 2023-09-04 — End: 2023-09-04

## 2023-09-04 MED ORDER — ONDANSETRON HCL 4 MG/2ML IJ SOLN: 4.0000 mg | INTRAMUSCULAR | Status: DC | PRN

## 2023-09-04 MED ORDER — DIPHENHYDRAMINE HCL 50 MG/ML IJ SOLN: 12.5000 mg | INTRAMUSCULAR | Status: DC | PRN

## 2023-09-04 MED ORDER — BENZOCAINE-MENTHOL 20-0.5 % EX AERO
1.0000 | INHALATION_SPRAY | CUTANEOUS | Status: DC | PRN
  Administered 2023-09-05: 1 via TOPICAL
  Filled 2023-09-04: qty 56

## 2023-09-04 MED ORDER — OXYTOCIN-SODIUM CHLORIDE 30-0.9 UT/500ML-% IV SOLN: 2.5000 [IU]/h | INTRAVENOUS | Status: DC

## 2023-09-04 MED ORDER — ONDANSETRON HCL 4 MG/2ML IJ SOLN: 4.0000 mg | Freq: Four times a day (QID) | INTRAMUSCULAR | Status: DC | PRN

## 2023-09-04 MED ORDER — LIDOCAINE HCL (PF) 1 % IJ SOLN: 30.0000 mL | INTRAMUSCULAR | Status: DC | PRN

## 2023-09-04 NOTE — H&P (Signed)
OB ADMISSION/ HISTORY & PHYSICAL:  Admission Date: 09/04/2023 12:46 PM  Admit Diagnosis: Normal labor  Dawn Zamora is a 33 y.o. female G3P1011 [redacted]w[redacted]d presenting from the office for early labor at 4 cm with bloody show and regular contractions. Endorses active FM, denies LOF and vaginal bleeding. Placenta previa noted on anatomy US, but was resolved by the 3rd trimester.   History of current pregnancy: G3P1011   Patient entered care with [facility] at 9 wks.   EDC 09/04/23 by LMP and congruent w/ 1st trimester [place] wk U/S.   Anatomy scan:  complete w/ placenta previa that resolved and was noted anterior at    Significant prenatal events:  Patient Active Problem List   Diagnosis Date Noted   Normal labor 09/04/2023   Uterine leiomyoma 03/01/2020    Prenatal Labs: ABO, Rh: --/--/AB POS (01/17 1309) Antibody: NEG (01/17 1309) Rubella: Immune (06/19 0000)  RPR: Nonreactive (06/19 0000)  HBsAg: Negative (06/19 0000)  HIV: Non-reactive (06/19 0000)  GTT: A1c 5.9, normal early gtt GBS: Negative/-- (01/06 0000)  GC/CHL: neg/neg Genetics: low-risk female  OB History  Gravida Para Term Preterm AB Living  3 1 1  1 1   SAB IAB Ectopic Multiple Live Births   1   1    # Outcome Date GA Lbr Len/2nd Weight Sex Type Anes PTL Lv  3 Current           2 Term 01/07/14 [redacted]w[redacted]d 01:33 / 00:30 3340 g M Vag-Spont EPI  LIV  1 IAB 2009            Medical / Surgical History: Past medical history:  Past Medical History:  Diagnosis Date   Anemia    Anxiety    Asthma    Generalized headaches    Hx of chlamydia infection    Normal labor 01/06/2014   NSVD (normal spontaneous vaginal delivery) 01/07/2014    Past surgical history:  Past Surgical History:  Procedure Laterality Date   NO PAST SURGERIES     Family History:  Family History  Problem Relation Age of Onset   Polydactyly Paternal Uncle    Sickle cell anemia Cousin    Diabetes Mother    Diabetes Maternal Aunt    Diabetes Maternal  Grandmother    Diabetes Maternal Grandfather     Social History:  reports that she quit smoking about 12 years ago. Her smoking use included cigarettes. She has never used smokeless tobacco. She reports that she does not currently use alcohol. She reports that she does not use drugs.  Allergies: Asa [aspirin], Coconut (cocos nucifera), Lemon oil, Lemon balm [melissa officinalis], Metronidazole, Phenergan [promethazine], Copper-containing compounds, and Nickel   Current Medications at time of admission:  Prior to Admission medications   Medication Sig Start Date End Date Taking? Authorizing Provider  cyclobenzaprine (FLEXERIL) 10 MG tablet Take 1 tablet (10 mg total) by mouth 2 (two) times daily as needed. 06/29/23   Raelyn Mora, CNM  famotidine (PEPCID) 20 MG tablet Take 2 tablets (40 mg total) by mouth 2 (two) times daily. 05/30/23   Warren-Hill, Clarita Crane, CNM  metroNIDAZOLE (METROGEL) 0.75 % vaginal gel Place 1 Applicatorful vaginally 2 (two) times daily.    [provider]  ondansetron (ZOFRAN) 4 MG tablet Take 2 tablets (8 mg total) by mouth every 8 (eight) hours as needed for nausea or vomiting. 05/30/23   Warren-Hill, Clarita Crane, CNM  Prenatal Vit-Fe Fumarate-FA (PRENATAL PO) Take 1 tablet by mouth daily.  [provider]  scopolamine (TRANSDERM-SCOP) 1 MG/3DAYS Place 1 patch (1.5 mg total) onto the skin every 3 (three) days. 06/02/23   Richardson Landry, CNM    Review of Systems: Constitutional: Negative   HENT: Negative   Eyes: Negative   Respiratory: Negative   Cardiovascular: Negative   Gastrointestinal: Negative  Genitourinary: pos for bloody show, neg for LOF   Musculoskeletal: Negative   Skin: Negative   Neurological: Negative   Endo/Heme/Allergies: Negative   Psychiatric/Behavioral: Negative    Physical Exam: VS: Blood pressure (!) 107/55, pulse 93, temperature 98.2 F (36.8 C), temperature source Oral, resp. rate 18, height 5\' 4"  (1.626 m),  weight 72.1 kg, last menstrual period 11/30/2022, unknown if currently breastfeeding. AAO x3, no signs of distress Cardiovascular: RRR Respiratory: Unlabored GU/GI: Abdomen gravid, non-tender, non-distended, active FM, vertex Extremities: no edema, negative for pain, tenderness, and cords  Cervical exam:Dilation: 5 Effacement (%): 80 Station: -1 Exam by:: Rhea Pink CNM FHR: baseline rate 135 / variability moderate / accelerations present / absent decelerations TOCO: 3-5 min   Prenatal Transfer Tool  Maternal Diabetes: No Genetic Screening: Normal Maternal Ultrasounds/Referrals: Normal Fetal Ultrasounds or other Referrals:  None Maternal Substance Abuse:  No Significant Maternal Medications:  None Significant Maternal Lab Results: Group B Strep negative Number of Prenatal Visits:greater than 3 verified prenatal visits Maternal Vaccinations:declined  Other Comments:  None    Assessment: 33 y.o. G3P1011 [redacted]w[redacted]d  Latent stage of labor FHR category 1 GBS neg Pain management plan: per pt's request, considering epidural   Plan:  Admit to L&D Routine admission orders Epidural PRN AROM Pitocin PRN Dr Su Hilt sent pt from the office and is aware of admission and plan of care  Roma Schanz DNP, CNM 09/04/2023 2:50 PM

## 2023-09-04 NOTE — Lactation Note (Signed)
This note was copied from a baby's chart. Lactation Consultation Note  Patient Name: Dawn Zamora FIEPP'I Date: 09/04/2023 Age:33 hours Reason for consult: Initial assessment;Term MOB latched infant on her left breast using the football and cross cradle hold with pillow support, infant was on and off the breast for 10 minutes during the feeding. MOB was taught hand expression and MOB expressed 3 mls of colostrum that was spoon fed to infant. MOB will continue to work on latching infant at the breast and knows to call for further latch assistance if needed. MOB will continue to BF infant by cues, on demand, every 2-3 hours, skin to skin. LC discussed the importance of maternal rest, balance meals& snacks, hydration. MOB was made aware of O/P services, breastfeeding support groups, community resources, and our phone # for post-discharge questions.    Maternal Data Has patient been taught Hand Expression?: Yes Does the patient have breastfeeding experience prior to this delivery?: Yes How long did the patient breastfeed?: Per MOB, she BF her 54 year old son for 12 months  Feeding Mother's Current Feeding Choice: Breast Milk  LATCH Score Latch: Repeated attempts needed to sustain latch, nipple held in mouth throughout feeding, stimulation needed to elicit sucking reflex.  Audible Swallowing: A few with stimulation  Type of Nipple: Everted at rest and after stimulation (short shafted)  Comfort (Breast/Nipple): Soft / non-tender  Hold (Positioning): Assistance needed to correctly position infant at breast and maintain latch.  LATCH Score: 7   Lactation Tools Discussed/Used    Interventions    Discharge Pump: DEBP;Personal;Hands Free  Consult Status Consult Status: Follow-up Date: 09/05/23 Follow-up type: In-patient    Frederico Hamman 09/04/2023, 11:22 PM

## 2023-09-05 DIAGNOSIS — Z8659 Personal history of other mental and behavioral disorders: Secondary | ICD-10-CM

## 2023-09-05 DIAGNOSIS — D509 Iron deficiency anemia, unspecified: Secondary | ICD-10-CM | POA: Diagnosis present

## 2023-09-05 LAB — CBC
HCT: 23.4 % — ABNORMAL LOW (ref 36.0–46.0)
Hemoglobin: 7.8 g/dL — ABNORMAL LOW (ref 12.0–15.0)
MCH: 25.8 pg — ABNORMAL LOW (ref 26.0–34.0)
MCHC: 33.3 g/dL (ref 30.0–36.0)
MCV: 77.5 fL — ABNORMAL LOW (ref 80.0–100.0)
Platelets: 265 10*3/uL (ref 150–400)
RBC: 3.02 MIL/uL — ABNORMAL LOW (ref 3.87–5.11)
RDW: 15.1 % (ref 11.5–15.5)
WBC: 12.8 10*3/uL — ABNORMAL HIGH (ref 4.0–10.5)
nRBC: 0 % (ref 0.0–0.2)

## 2023-09-05 LAB — RPR: RPR Ser Ql: NONREACTIVE

## 2023-09-05 MED ORDER — POLYSACCHARIDE IRON COMPLEX 150 MG PO CAPS
150.0000 mg | ORAL_CAPSULE | Freq: Every day | ORAL | Status: DC
  Administered 2023-09-05 – 2023-09-06 (×2): 150 mg via ORAL
  Filled 2023-09-05 (×2): qty 1

## 2023-09-05 NOTE — Lactation Note (Signed)
This note was copied from a baby's chart. Lactation Consultation Note  Patient Name: Dawn Zamora [id number] Date: 09/05/2023 Age:34 hours Reason for consult: Follow-up assessment;Mother's request;Difficult latch;Term (Weight loss -1.50%) MOB latched infant on her right breast using pillow support and football hold position, Per MOB, she likes the football hold instead of cross cradle hold. MOB continue to work towards infant learning how to sustain her latch at the breast, infant was on and off the breast for 10 minutes. Afterwards infant was given 4 mls of colostrum by spoon. Infant had large stool and void that MOB was changing while LC left the room. Per MOB, infant had 2 voids and 3 stools since birth. MOB will continue to BF infant by cues, on demand, every 2-3 hours, skin to skin and knows to call for further latch assistance if needed.   Maternal Data Has patient been taught Hand Expression?: Yes  Feeding Mother's Current Feeding Choice: Breast Milk  LATCH Score Latch: Repeated attempts needed to sustain latch, nipple held in mouth throughout feeding, stimulation needed to elicit sucking reflex.  Audible Swallowing: A few with stimulation  Type of Nipple: Everted at rest and after stimulation (short shafted)  Comfort (Breast/Nipple): Soft / non-tender  Hold (Positioning): Assistance needed to correctly position infant at breast and maintain latch.  LATCH Score: 7   Lactation Tools Discussed/Used    Interventions Interventions: Skin to skin;Assisted with latch;Hand express;Breast compression;Adjust position;Support pillows;Position options;Expressed milk;Education  Discharge    Consult Status Consult Status: Follow-up Date: 09/06/23 Follow-up type: In-patient    Frederico Hamman 09/05/2023, 4:22 PM

## 2023-09-05 NOTE — Progress Notes (Signed)
PPD# 1 SVD w/ intact perineum Information for the patient's newborn:  Raheema, Jaquez [[id number]]  female  Baby Name undecided   S:   Reports feeling good Tolerating PO fluid and solids No nausea or vomiting Bleeding is light Pain controlled with acetaminophen and ibuprofen (OTC) Up ad lib / ambulatory / voiding w/o difficulty Feeding: Breast    O:   VS: BP (!) 98/55 (BP Location: Left Arm)   Pulse 65   Temp 97.8 F (36.6 C) (Oral)   Resp 18   Ht 5\' 4"  (1.626 m)   Wt 72.1 kg   LMP 11/30/2022   SpO2 99%   Breastfeeding Unknown   BMI 27.29 kg/m   LABS:  Recent Labs    09/04/23 1310 09/05/23 0417  WBC 8.5 12.8*  HGB 8.8* 7.8*  PLT 313 265   Blood type: --/--/AB POS (01/17 1309) Rubella: Immune (06/19 0000)                      I&O: Intake/Output      01/17 0701 01/18 0700   Blood 250   Total Output 250   Net -250       Urine Occurrence 1 x     Physical Exam: Alert and oriented X3 Lungs: Clear and unlabored Heart: regular rate and rhythm / no mumurs Abdomen: soft, non-tender, non-distended  Fundus: firm, non-tender, U-2 Perineum: intact Lochia: appropriate Extremities: no edema, negative for calf pain, tenderness, or cords    A:  PPD # 1  Normal exam  P:  Routine postpartum orders Lactation support PRN Anticipate D/C on PP day 2   Roma Schanz, DNP, CNM 09/05/2023, 6:39 AM

## 2023-09-05 NOTE — Lactation Note (Signed)
This note was copied from a baby's chart. Lactation Consultation Note  Patient Name: Girl Chiquetta Peeler [id number] Date: 09/05/2023 Age:34 hours  Reason for consult: Follow-up assessment;Term;Breastfeeding assistance;Mother's request  P2, [redacted]w[redacted]d, 1.50% weight loss  Mother requesting help with latching. Mother has breastfeeding experience. Baby has been spitty and not interested in latching. She recently had a bath, alert and cueing to feed. Adjusted pillows to allow mother to latch baby deeper onto the breast. Mother can express colostrum with ease. Baby latched well and continues to feed when LC left the room.   Mother encouraged to latch baby with feeding cues, place baby skin to skin if not latching, and call for assistance with breastfeeding as needed. Anticipate as baby approaches 24 hours of age, baby should breastfeed more often, 8-12 plus times in 24 hours and may "cluster feed".     Feeding Mother's Current Feeding Choice: Breast Milk  LATCH Score Latch: Grasps breast easily, tongue down, lips flanged, rhythmical sucking.  Audible Swallowing: A few with stimulation  Type of Nipple: Everted at rest and after stimulation  Comfort (Breast/Nipple): Soft / non-tender  Hold (Positioning): Assistance needed to correctly position infant at breast and maintain latch.  LATCH Score: 8    Interventions Interventions: Breast feeding basics reviewed;Assisted with latch;Skin to skin;Hand express;Breast compression;Adjust position;Support pillows;Position options;Education    Consult Status Consult Status: Follow-up Date: 09/06/23 Follow-up type: In-patient    Christella Hartigan M 09/05/2023, 10:03 AM

## 2023-09-05 NOTE — Discharge Summary (Signed)
SVD OB Discharge Summary       Patient Name: Dawn Zamora DOB: 1991/03/14 MRN: [id number]  Date of admission: 09/04/2023 Delivering MD: Rhea Pink B Date of delivery: 09/04/2023 Type of delivery: SVD  Newborn Data: Sex: Baby female Live born female  Birth Weight: 6 lb 7.4 oz (2930 g) APGAR: 8, 9  Newborn Delivery   Birth date/time: 09/04/2023 17:57:00 Delivery type: Vaginal, Spontaneous     Feeding: breast Infant being discharge to home with mother in stable condition.   Admitting diagnosis: Normal labor [O80, Z37.9] Intrauterine pregnancy: [redacted]w[redacted]d     Secondary diagnosis:  Active Problems:   Uterine leiomyoma   Normal labor   IDA (iron deficiency anemia)   SVD (spontaneous vaginal delivery)   Normal postpartum course   H/O anxiety disorder                                Complications: None                                                              Intrapartum Procedures: spontaneous vaginal delivery Postpartum Procedures: none Complications-Operative and Postpartum: none Augmentation: AROM   History of Present Illness: [name] is a 33 y.o. female, [id number], who presents at [redacted]w[redacted]d weeks gestation. The patient has been followed at  [facility]  Her pregnancy has been complicated by:  Patient Active Problem List   Diagnosis Date Noted   IDA (iron deficiency anemia) 09/05/2023   SVD (spontaneous vaginal delivery) 09/05/2023   Normal postpartum course 09/05/2023   H/O anxiety disorder 09/05/2023   Normal labor 09/04/2023   Uterine leiomyoma 03/01/2020     Active Ambulatory Problems    Diagnosis Date Noted   Uterine leiomyoma 03/01/2020   Resolved Ambulatory Problems    Diagnosis Date Noted   Normal labor 01/06/2014   NSVD (normal spontaneous vaginal delivery) 01/07/2014   Adjustment disorder with depressed mood 02/15/2017   Cervical incompetence affecting management of mother, antepartum 04/08/2023    Placenta previa (anterior) 04/15/2023   Bacteriuria 02/10/2023   Past Medical History:  Diagnosis Date   Anemia    Anxiety    Asthma    Generalized headaches    Hx of chlamydia infection      Hospital course:  Onset of Labor With Vaginal Delivery      33 y.o. yo [id number] at [redacted]w[redacted]d was admitted in Latent Labor on 09/04/2023. Labor course was complicated by pt was admitted on 1/17 for latent labor,  with h/o anxiety, asthma no meds for either and and anemia. Pt progressed with AROM to SVD on 1/17 @ 1757 over intact perineum with ebl of , hgb drop of 8.8-7.9, asymptomatic and on po iron.  Membrane Rupture Time/Date: 2:45 PM,09/04/2023  Delivery Method:Vaginal, Spontaneous Operative Delivery:N/A Episiotomy: None Lacerations:  None Patient had a postpartum course complicated by h/o anxiety but mood remains stable no meds.  She is ambulating, tolerating a regular diet, passing flatus, and urinating well. Patient is discharged home in stable condition on 09/06/23.  Newborn Data: Birth date:09/04/2023 Birth time:5:57 PM Gender:Female Living status:Living Apgars:8 ,9  Weight:2930 g Postpartum Day # 2: Patient up ad lib, denies syncope or dizziness. Reports  consuming regular diet without issues and denies N/V. Patient reports 0 bowel movement + passing flatus.  Denies issues with urination and reports bleeding is "lighter."  Patient is Breastfeeding and reports going well.  Desires undecided for postpartum contraception.  Pain is being appropriately managed with use of po meds. Pt mood remains stable and will have a 2 week mood check if needed.    Physical exam  Vitals:   09/05/23 0938 09/05/23 1440 09/05/23 2020 09/06/23 0549  BP: 101/64 (!) 99/59 99/63 (!) 97/59  Pulse: 83 76 85 72  Resp: 18 17 18 18   Temp: 98.3 F (36.8 C) 97.9 F (36.6 C) 98.5 F (36.9 C) 98.3 F (36.8 C)  TempSrc: Oral Oral Oral Oral  SpO2: 98% 99%  99%  Weight:      Height:       General: alert,  cooperative, and no distress Lochia: appropriate Uterine Fundus: firm Perineum: intact DVT Evaluation: No evidence of DVT seen on physical exam. Negative Homan's sign. No cords or calf tenderness. No significant calf/ankle edema.  Labs: Lab Results  Component Value Date   WBC 12.8 (H) 09/05/2023   HGB 7.8 (L) 09/05/2023   HCT 23.4 (L) 09/05/2023   MCV 77.5 (L) 09/05/2023   PLT 265 09/05/2023      Latest Ref Rng & Units 03/24/2023   11:21 AM  CMP  Glucose 70 - 99 mg/dL 89   BUN 6 - 20 mg/dL <5   Creatinine 5.78 - 1.00 mg/dL 4.69   Sodium 629 - 528 mmol/L 133   Potassium 3.5 - 5.1 mmol/L 3.6   Chloride 98 - 111 mmol/L 98   CO2 22 - 32 mmol/L 22   Calcium 8.9 - 10.3 mg/dL 8.9   Total Protein 6.5 - 8.1 g/dL 6.5   Total Bilirubin 0.3 - 1.2 mg/dL 0.4   Alkaline Phos 38 - 126 U/L 46   AST 15 - 41 U/L 15   ALT 0 - 44 U/L 15     Date of discharge: 09/06/2023 Discharge Diagnoses: Term Pregnancy-delivered Discharge instruction: per After Visit Summary and "Baby and Me Booklet".  After visit meds:   Activity:           unrestricted Advance as tolerated. Pelvic rest for 6 weeks.  Diet:                routine Medications: PNV, Ibuprofen, Colace, and Iron Postpartum contraception: Undecided Condition:  Pt discharge to home with baby in stable  Meds: Allergies as of 09/06/2023       Reactions   Asa [aspirin] Shortness Of Breath   Respiratory distress. Pt states she can take ibuprofen   Coconut (cocos Nucifera) Anaphylaxis   Lemon Oil Hives   Lemon Balm [melissa Officinalis] Hives   Metronidazole Diarrhea, Nausea And Vomiting   Phenergan [promethazine] Other (See Comments)   Chest pain   Copper-containing Compounds Rash   Nickel Rash        Medication List     STOP taking these medications    cyclobenzaprine 10 MG tablet Commonly known as: FLEXERIL   famotidine 20 MG tablet Commonly known as: PEPCID   metroNIDAZOLE 0.75 % vaginal gel Commonly known as:  METROGEL   ondansetron 4 MG tablet Commonly known as: ZOFRAN   scopolamine 1 MG/3DAYS Commonly known as: TRANSDERM-SCOP       TAKE these medications    ibuprofen 600 MG tablet Commonly known as: ADVIL Take 1 tablet (600 mg total) by mouth  every 6 (six) hours.   iron polysaccharides 150 MG capsule Commonly known as: NIFEREX Take 1 capsule (150 mg total) by mouth daily. Start taking on: September 07, 2023   PRENATAL PO Take 1 tablet by mouth daily.        Discharge Follow Up:   Follow-up Information     [facility]. Schedule an appointment as soon as possible for a visit in 1 week(s).   Specialty: Obstetrics and Gynecology Why: 2 week mood check and 6 week PPV Contact information: [address] [place] [address] [phone]                 [facility] CNM, FNP-C, PMHNP-BC  [address]  [place], [place] [address]  Cell: [phone]  Office Phone: [phone] Fax: [phone] 09/06/2023  1:02 PM

## 2023-09-06 LAB — BIRTH TISSUE RECOVERY COLLECTION (PLACENTA DONATION)

## 2023-09-06 MED ORDER — IBUPROFEN 600 MG PO TABS: 600.0000 mg | ORAL_TABLET | Freq: Four times a day (QID) | ORAL | 0 refills | Status: AC

## 2023-09-06 MED ORDER — POLYSACCHARIDE IRON COMPLEX 150 MG PO CAPS: 150.0000 mg | ORAL_CAPSULE | Freq: Every day | ORAL | 3 refills | Status: AC

## 2023-09-06 NOTE — Lactation Note (Signed)
This note was copied from a baby's chart. Lactation Consultation Note  Patient Name: Dawn Zamora VFIEP'P Date: 09/06/2023 Age:34 hours Reason for consult: Follow-up assessment  P2, Returned to room to assist with feeding. Mother had baby latched on R breast and is happy that baby is latching on that side.  Noted intermittent swallows. Discussed if baby is sleepy at the breast once home, mother should pump and give volume back. Praised her for her efforts.  Feed on demand with cues.  Goal 8-12+ times per day after first 24 hrs.  Place baby STS if not cueing.  Wake baby for feedings if needed.  Feeding Mother's Current Feeding Choice: Breast Milk  LATCH Score Latch: Grasps breast easily, tongue down, lips flanged, rhythmical sucking.  Audible Swallowing: A few with stimulation  Type of Nipple: Everted at rest and after stimulation  Comfort (Breast/Nipple): Soft / non-tender  Hold (Positioning): Assistance needed to correctly position infant at breast and maintain latch.  LATCH Score: 8   Lactation Tools Discussed/Used    Interventions Interventions: Breast feeding basics reviewed;Education  Discharge Discharge Education: Warning signs for feeding baby  Consult Status Consult Status: Complete    Hardie Pulley 09/06/2023, 3:15 PM

## 2023-09-06 NOTE — Lactation Note (Signed)
This note was copied from a baby's chart. Lactation Consultation Note  Patient Name: Dawn Zamora RUEAV'W Date: 09/06/2023 Age:34 hours Reason for consult: Follow-up assessment  P2, Mother states she has had difficulty latching baby on her R breast.  Had mother prepump with manual pump. Baby latched briefly after recent feeding. Discussed pumping at home with DEBP if needed. Feed on demand with cues.  Goal 8-12+ times per day after first 24 hrs.  Place baby STS if not cueing.  Reviewed engorgement care and monitoring voids/stools. Provided coconut oil for tender nipple.    Maternal Data Has patient been taught Hand Expression?: Yes Does the patient have breastfeeding experience prior to this delivery?: Yes  Feeding Mother's Current Feeding Choice: Breast Milk  LATCH Score Latch: Repeated attempts needed to sustain latch, nipple held in mouth throughout feeding, stimulation needed to elicit sucking reflex.  Audible Swallowing: A few with stimulation  Type of Nipple: Everted at rest and after stimulation  Comfort (Breast/Nipple): Soft / non-tender  Hold (Positioning): Assistance needed to correctly position infant at breast and maintain latch.  LATCH Score: 7   Lactation Tools Discussed/Used Tools: Pump Breast pump type: Manual Reason for Pumping: prepump Pumping frequency: PRN  Interventions Interventions: Breast feeding basics reviewed;Assisted with latch;Skin to skin;Hand express;Pre-pump if needed;Hand pump;Education  Discharge Discharge Education: Engorgement and breast care;Warning signs for feeding baby Pump: Personal;DEBP;Hands Free  Consult Status Consult Status: Complete Date: 09/06/23    Dawn Byes Boschen  RN IBCLC 09/06/2023, 11:22 AM

## 2023-09-06 NOTE — Progress Notes (Addendum)
MOB was referred for history of anxiety. * Referral screened out by Clinical Social Worker because none of the following criteria appear to apply: ~ History of anxiety/depression during this pregnancy, or of post-partum depression following prior delivery. ~ Diagnosis of anxiety and/or depression within last 3 years. Per chart review, MOB was diagnosed with Anxiety in 2018. Per OBGYN, patient's mood is stable. Edinburgh score of 5 during current admission. OR * MOB's symptoms currently being treated with medication and/or therapy. Please contact the Clinical Social Worker if needs arise, by [facility] request, or if MOB scores greater than 9/yes to question 10 on Edinburgh Postpartum Depression Screen.  Signed,  Norberto Sorenson, MSW, LCSWA, LCASA 10-11-2023 9:11 AM

## 2023-09-15 ENCOUNTER — Telehealth (HOSPITAL_COMMUNITY): Payer: Self-pay | Admitting: *Deleted

## 2023-09-15 NOTE — Telephone Encounter (Signed)
09/15/2023  Name: Dawn Zamora MRN: [id number] DOB: 12-06-1990  Reason for Call:  Transition of Care Hospital Discharge Call  Contact Status: Patient Contact Status: Message  Language assistant needed:          Follow-Up Questions:    Inocente Salles Postnatal Depression Scale:  In the Past 7 Days:    PHQ2-9 Depression Scale:     Discharge Follow-up:    Post-discharge interventions: NA  Kenston Longton,RN 09/15/2023 1917

## 2023-09-17 ENCOUNTER — Inpatient Hospital Stay (HOSPITAL_COMMUNITY)
Admission: AD | Admit: 2023-09-17 | Discharge: 2023-09-17 | Disposition: A | Discharge status: Home / Self Care | Payer: Medicaid Other | Attending: Obstetrics and Gynecology | Admitting: Obstetrics and Gynecology

## 2023-09-17 ENCOUNTER — Encounter (HOSPITAL_COMMUNITY): Payer: Self-pay | Admitting: Obstetrics and Gynecology

## 2023-09-17 ENCOUNTER — Other Ambulatory Visit: Payer: Self-pay

## 2023-09-17 ENCOUNTER — Telehealth (HOSPITAL_COMMUNITY): Payer: Self-pay | Admitting: *Deleted

## 2023-09-17 DIAGNOSIS — N3001 Acute cystitis with hematuria: Secondary | ICD-10-CM | POA: Diagnosis not present

## 2023-09-17 DIAGNOSIS — O9089 Other complications of the puerperium, not elsewhere classified: Secondary | ICD-10-CM | POA: Diagnosis not present

## 2023-09-17 DIAGNOSIS — O8622 Infection of bladder following delivery: Secondary | ICD-10-CM | POA: Insufficient documentation

## 2023-09-17 DIAGNOSIS — O9953 Diseases of the respiratory system complicating the puerperium: Secondary | ICD-10-CM | POA: Diagnosis not present

## 2023-09-17 LAB — URINALYSIS, ROUTINE W REFLEX MICROSCOPIC
Glucose, UA: NEGATIVE mg/dL
Ketones, ur: NEGATIVE mg/dL
Leukocytes,Ua: NEGATIVE
Nitrite: NEGATIVE
Protein, ur: 100 mg/dL — AB
Specific Gravity, Urine: 1.031 — ABNORMAL HIGH (ref 1.005–1.030)
pH: 5 (ref 5.0–8.0)

## 2023-09-17 MED ORDER — CEPHALEXIN 500 MG PO CAPS: 500.0000 mg | ORAL_CAPSULE | Freq: Three times a day (TID) | ORAL | 0 refills | Status: AC

## 2023-09-17 MED ORDER — PHENAZOPYRIDINE HCL 200 MG PO TABS: 200.0000 mg | ORAL_TABLET | Freq: Three times a day (TID) | ORAL | 0 refills | Status: AC

## 2023-09-17 NOTE — MAU Provider Note (Signed)
MAU Provider Note  Chief Complaint: Pain with Urination  SUBJECTIVE HPI: Dawn Zamora is a 33 y.o. [id number] who is postpartum from NSVD 1/17 and presents to maternity admissions reporting pain with urination. Pregnancy c/b none. Receives [facility] with [facility].  Patient notes feeling burning/stinging sensation with initiation of urine stream. Denies increase in frequency, hematuria, change in odor. Lochia very light. Denies abdominal/vaginal pain, fever/chills, change in discharge. Otherwise doing well. Drinking lots of water. Feels she is emptying her bladder fully but has some hesitation with starting her stream.   HPI  Past Medical History:  Diagnosis Date   Adjustment disorder with depressed mood 02/15/2017   Anemia    Anxiety    Asthma    Cervical incompetence affecting management of mother, antepartum 04/08/2023   With prior preg     Generalized headaches    Hx of chlamydia infection    Normal labor 01/06/2014   NSVD (normal spontaneous vaginal delivery) 01/07/2014   Placenta previa (anterior) 04/15/2023   Past Surgical History:  Procedure Laterality Date   NO PAST SURGERIES     Social History   Socioeconomic History   Marital status: Single    Spouse name: Not on file   Number of children: 1   Years of education: Not on file   Highest education level: Bachelor's degree (e.g., BA, AB, BS)  Occupational History    Comment: student - Masters program  Tobacco Use   Smoking status: Former    Current packs/day: 0.00    Types: Cigarettes    Quit date: 10/20/2010    Years since quitting: 12.9   Smokeless tobacco: Never  Vaping Use   Vaping status: Never Used  Substance and Sexual Activity   Alcohol use: Not Currently   Drug use: No   Sexual activity: Not Currently    Birth control/protection: None  Other Topics Concern   Not on file  Social History Narrative   Lives with child   Caffeine- none   Social Drivers of Corporate investment banker Strain: Not on file  Food  Insecurity: No Food Insecurity (09/04/2023)   Hunger Vital Sign    Worried About Running Out of Food in the Last Year: Never true    Ran Out of Food in the Last Year: Never true  Transportation Needs: No Transportation Needs (09/04/2023)   PRAPARE - Administrator, Civil Service (Medical): No    Lack of Transportation (Non-Medical): No  Physical Activity: Not on file  Stress: Not on file  Social Connections: Not on file  Intimate Partner Violence: Not At Risk (09/04/2023)   Humiliation, Afraid, Rape, and Kick questionnaire    Fear of Current or Ex-Partner: No    Emotionally Abused: No    Physically Abused: No    Sexually Abused: No   No current facility-administered medications on file prior to encounter.   Current Outpatient Medications on File Prior to Encounter  Medication Sig Dispense Refill   ibuprofen (ADVIL) 600 MG tablet Take 1 tablet (600 mg total) by mouth every 6 (six) hours. 30 tablet 0   iron polysaccharides (NIFEREX) 150 MG capsule Take 1 capsule (150 mg total) by mouth daily. 30 capsule 3   Prenatal Vit-Fe Fumarate-FA (PRENATAL PO) Take 1 tablet by mouth daily.     Allergies  Allergen Reactions   Asa [Aspirin] Shortness Of Breath    Respiratory distress. Pt states she can take ibuprofen   Coconut (Cocos Nucifera) Anaphylaxis   Lemon Oil Hives  Lemon Balm [Melissa Officinalis] Hives   Metronidazole Diarrhea and Nausea And Vomiting   Phenergan [Promethazine] Other (See Comments)    Chest pain   Copper-Containing Compounds Rash   Nickel Rash    ROS:  Pertinent positives/negatives listed above.  I have reviewed patient's Past Medical Hx, Surgical Hx, Family Hx, Social Hx, medications and allergies.   Physical Exam  Patient Vitals for the past 24 hrs:  BP Temp Temp src Pulse Resp SpO2 Height Weight  09/17/23 1846 110/72 98.3 F (36.8 C) Oral 87 19 99 % -- --  09/17/23 1840 -- -- -- -- -- -- 5\' 4"  (1.626 m) 64.8 kg   Constitutional:  Well-developed, well-nourished female in no acute distress  Cardiovascular: normal rate Respiratory: normal effort GI: Abd soft, non-tender MS: Extremities nontender, no edema, normal ROM Neurologic: Alert and oriented x 4  GU: Neg CVAT  LAB RESULTS Results for orders placed or performed during the hospital encounter of 09/17/23 (from the past 24 hours)  Urinalysis, Routine w reflex microscopic -Urine, Clean Catch     Status: Abnormal   Collection Time: 09/17/23  7:04 PM  Result Value Ref Range   Color, Urine YELLOW YELLOW   APPearance HAZY (A) CLEAR   Specific Gravity, Urine 1.031 (H) 1.005 - 1.030   pH 5.0 5.0 - 8.0   Glucose, UA NEGATIVE NEGATIVE mg/dL   Hgb urine dipstick MODERATE (A) NEGATIVE   Bilirubin Urine SMALL (A) NEGATIVE   Ketones, ur NEGATIVE NEGATIVE mg/dL   Protein, ur 161 (A) NEGATIVE mg/dL   Nitrite NEGATIVE NEGATIVE   Leukocytes,Ua NEGATIVE NEGATIVE   RBC / HPF 11-20 0 - 5 RBC/hpf   WBC, UA 6-10 0 - 5 WBC/hpf   Bacteria, UA RARE (A) NONE SEEN   Squamous Epithelial / HPF 0-5 0 - 5 /HPF   Mucus PRESENT    Non Squamous Epithelial 0-5 (A) NONE SEEN    --/--/AB POS (01/17 1309)  IMAGING No results found.  MAU Management/MDM: Orders Placed This Encounter  Procedures   Culture, OB Urine   Urinalysis, Routine w reflex microscopic -Urine, Clean Catch    Meds ordered this encounter  Medications   phenazopyridine (PYRIDIUM) 200 MG tablet    Sig: Take 1 tablet (200 mg total) by mouth 3 (three) times daily with meals for 3 days.    Dispense:  10 tablet    Refill:  0   cephALEXin (KEFLEX) 500 MG capsule    Sig: Take 1 capsule (500 mg total) by mouth 3 (three) times daily for 7 days.    Dispense:  21 capsule    Refill:  0     Available prenatal/delivery records reviewed.  Patient is 2 weeks postpartum from uncomplicated pregnancy/NSVD with dysuria. Suspect UTI. Does not have any fever/vital sign abnormalities/systemic symptoms to suggest pyelo/sepsis at  this time. She additionally feels she is emptying her bladder, so I do not suspect PP urinary retention. Offered external genitalia exam, however do not feel this is absolutely necessary as patient did not have any tears with delivery. She declines.  Patient's UA with signs of dehydration, bacteria, WBCs. Not conclusive for UTI, however given symptoms, will treat with pyridium and keflex while culture results.  ASSESSMENT 1. Acute cystitis with hematuria   2. Postpartum care following vaginal delivery     PLAN Discharge home with strict return precautions. Allergies as of 09/17/2023       Reactions   Asa [aspirin] Shortness Of Breath   Respiratory distress.  Pt states she can take ibuprofen   Coconut (cocos Nucifera) Anaphylaxis   Lemon Oil Hives   Lemon Balm [melissa Officinalis] Hives   Metronidazole Diarrhea, Nausea And Vomiting   Phenergan [promethazine] Other (See Comments)   Chest pain   Copper-containing Compounds Rash   Nickel Rash        Medication List     TAKE these medications    cephALEXin 500 MG capsule Commonly known as: KEFLEX Take 1 capsule (500 mg total) by mouth 3 (three) times daily for 7 days.   ibuprofen 600 MG tablet Commonly known as: ADVIL Take 1 tablet (600 mg total) by mouth every 6 (six) hours.   iron polysaccharides 150 MG capsule Commonly known as: NIFEREX Take 1 capsule (150 mg total) by mouth daily.   phenazopyridine 200 MG tablet Commonly known as: Pyridium Take 1 tablet (200 mg total) by mouth 3 (three) times daily with meals for 3 days.   PRENATAL PO Take 1 tablet by mouth daily.         Wylene Simmer, MD OB Fellow 09/17/2023  7:45 PM

## 2023-09-17 NOTE — MAU Note (Signed)
Dawn Zamora is a 33 y.o. at Unknown here in MAU reporting: for the past 3 days she's had pain with urination.  Reports today the pain is worse than the past 2 days.  Denies increased frequency and urgency. States pain is burning, stabbing, stinging and pressure when stream starts. S/P NVD 09/04/2023  LMP: NA Onset of complaint: 3 days Pain score: 9 Vitals:   09/17/23 1846  BP: 110/72  Pulse: 87  Resp: 19  Temp: 98.3 F (36.8 C)  SpO2: 99%     FHT: NA  Lab orders placed from triage: UA

## 2023-09-17 NOTE — Telephone Encounter (Signed)
09/17/2023  Name: Dawn Zamora MRN: [id number] DOB: [birth year]  Reason for Call:  Transition of Care Hospital Discharge Call  Contact Status: Patient Contact Status: Complete (Patient returned phone call from 09/15/2023 and left voicemail. This RN returned message.)  Language assistant needed: Interpreter Mode: Interpreter Not Needed        Follow-Up Questions: Do You Have Any Concerns About Your Health As You Heal From Delivery?: Yes What Concerns Do You Have About Your Health?: During callback, patient was currently enroute to hospital after speaking with staff in office for painful urination for about 2-3 days. Staff told her she could make appointment in the office for tomorrow, or she could go to MAU. Patient decided to go to MAU due to the severity of the pain and not being able to wait until tomorrow. Do You Have Any Concerns About Your Infants Health?: Yes What Concerns Do You Have About Your Baby?: Patient states she is still having difficulty latching baby to the breast. Provided information about lactation resources and support groups via email.  Edinburgh Postnatal Depression Scale:  In the Past 7 Days: I have been able to laugh and see the funny side of things.: As much as I always could I have looked forward with enjoyment to things.: As much as I ever did I have blamed myself unnecessarily when things went wrong.: Yes, some of the time I have been anxious or worried for no good reason.: Hardly ever I have felt scared or panicky for no good reason.: No, not at all Things have been getting on top of me.: No, I have been coping as well as ever I have been so unhappy that I have had difficulty sleeping.: Not at all I have felt sad or miserable.: Yes, quite often I have been so unhappy that I have been crying.: No, never The thought of harming myself has occurred to me.: Never Inocente Salles Postnatal Depression Scale Total: 5  PHQ2-9 Depression Scale:     Discharge  Follow-up: Edinburgh score requires follow up?: No Patient was advised of the following resources:: Support Group, Breastfeeding Support Group Patient referred to:: [facility] (patient already enroute to MAU)  Post-discharge interventions: Reviewed Newborn Safe Sleep Practices  Malachy Mood  09/17/2023 1652

## 2023-11-02 ENCOUNTER — Telehealth (HOSPITAL_COMMUNITY): Payer: Self-pay | Admitting: Lactation Services

## 2023-11-02 NOTE — Telephone Encounter (Signed)
 Dawn Zamora called with concerns that she has had a decrease in the amount of breast milk that she is pumping. She was expressing 10-11 oz per session and now expressing 4-5 oz. She reports she pumps every 4 hours (about 5-6 times per day). She is also fully breastfeeding/ latching her baby. She reports baby is gaining weight and growing very well.  Mother was initially pumping to establish her milk supply in the first two weeks and continued for milk storage. Dawn Zamora says she is returning to work next week.   Advised to breastfeed baby often and allow her baby to regulate milk production. If needing to boost milk supply and upon returning to work, pump every 3 hours for 15-20 minutes when away from her baby to maintain her supply.   She also reports she has a pediatric appointment tomorrow. Baby has not stooled in one week. Baby will also have a  weight check to verify if her breastfeeding is meeting infant's nutritional needs. Mother was informed of OP Lactation support group at [facility] on Wednesdays from 10-11 am and weight scales are available for mother to weigh her baby.

## 2024-03-17 IMAGING — US US PELVIS COMPLETE TRANSABD/TRANSVAG W DUPLEX AND/OR DOPPLER
1 series · 15 of 25 positions shown · non-contrast
Comparison: 10/07/2020

CLINICAL DATA: Pelvic pain for 3 days

EXAM:
TRANSABDOMINAL AND TRANSVAGINAL ULTRASOUND OF PELVIS
DOPPLER ULTRASOUND OF OVARIES
TECHNIQUE: Both transabdominal and transvaginal ultrasound examinations of the
pelvis were performed. Transabdominal technique was performed for
global imaging of the pelvis including uterus, ovaries, adnexal
regions, and pelvic cul-de-sac.
It was necessary to proceed with endovaginal exam following the
transabdominal exam to visualize the 10/07/2020. Color and duplex
Doppler ultrasound was utilized to evaluate blood flow to the
ovaries.

[Series 1: us transvaginal non-ob mc & wl · 74 acquisitions, 15 frames shown]
[im 1/74]
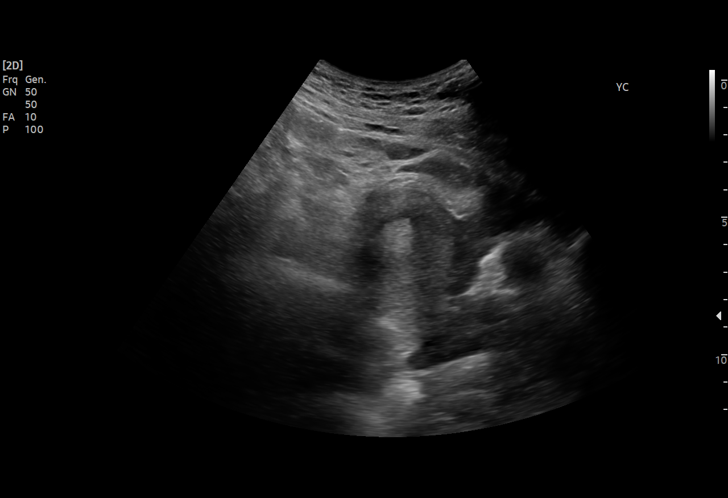
[im 7/74]
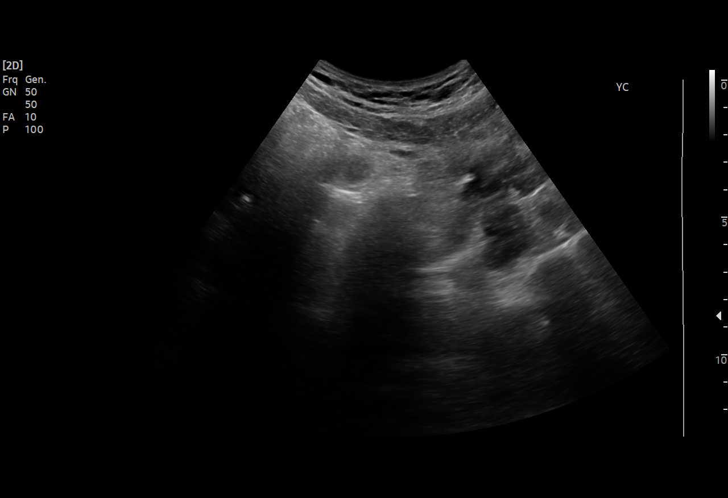
[im 13/74]
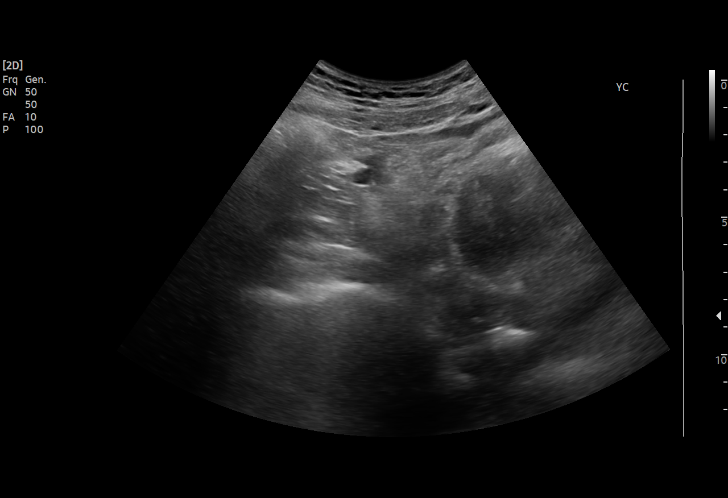
[im 16/74]
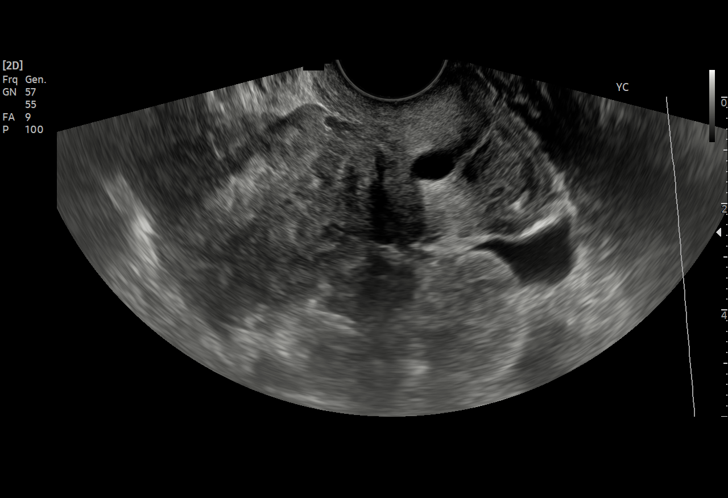
[im 22/74]
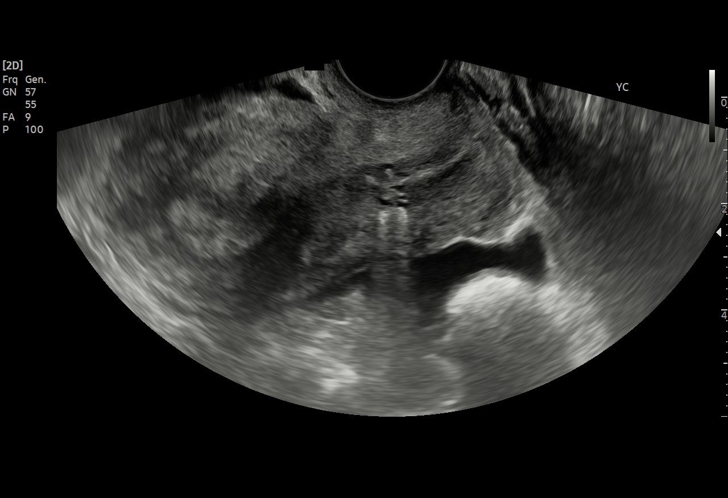
[im 28/74]
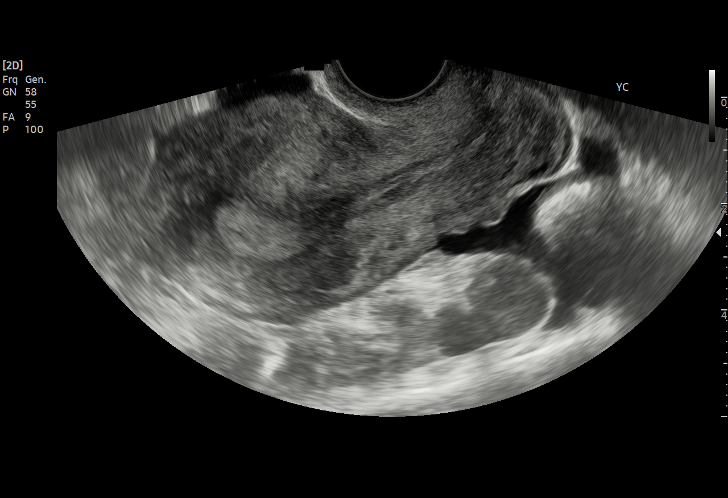
[im 31/74]
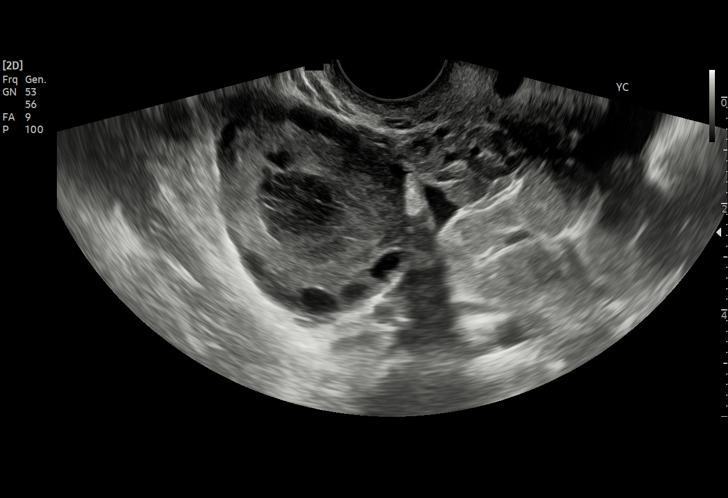
[im 37/74]
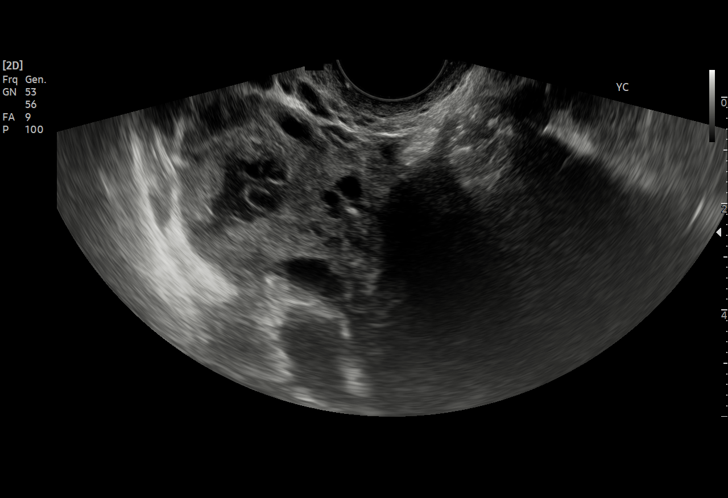
[im 43/74]
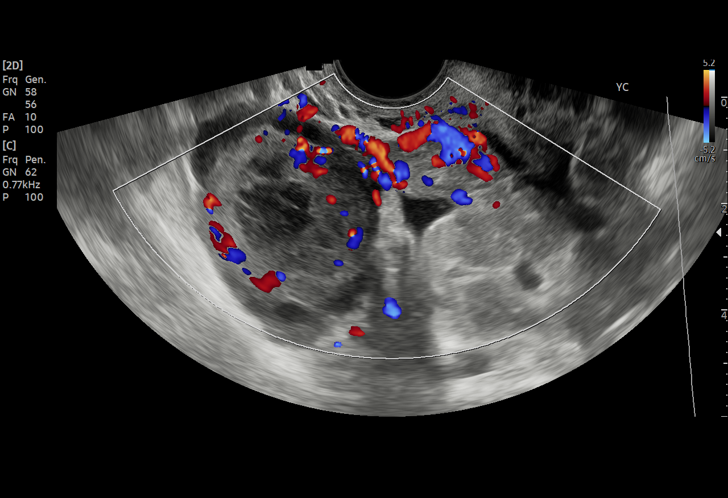
[im 46/74]
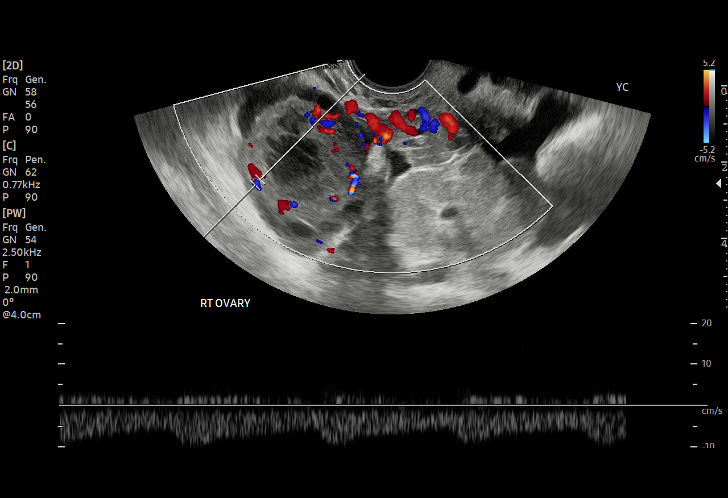
[im 52/74]
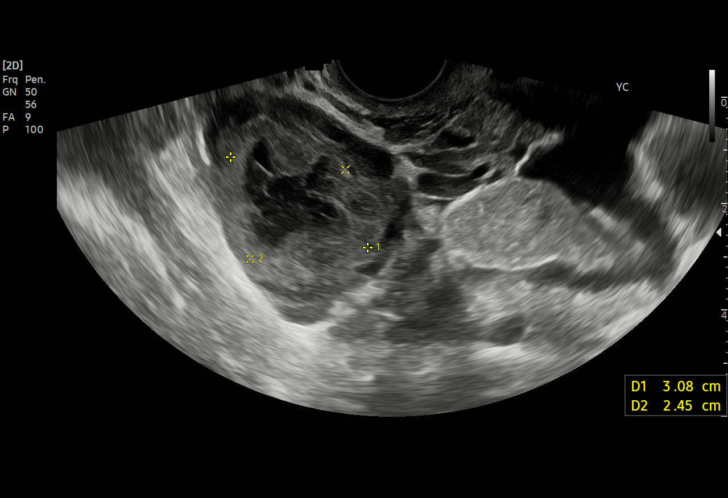
[im 58/74]
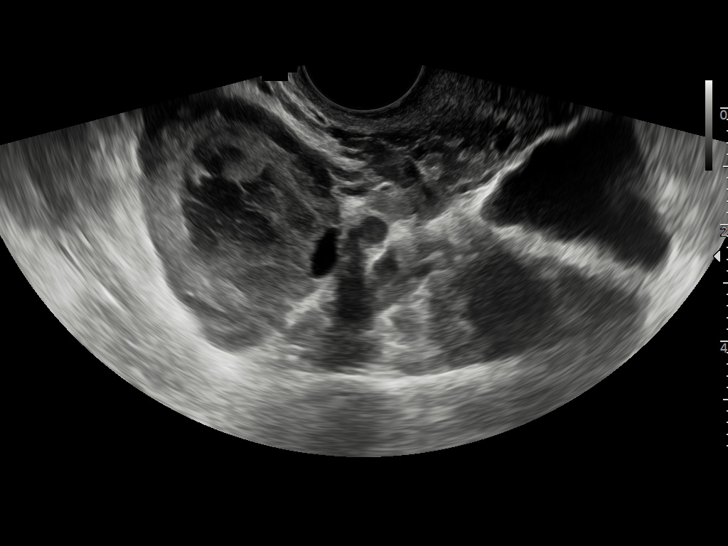
[im 61/74]
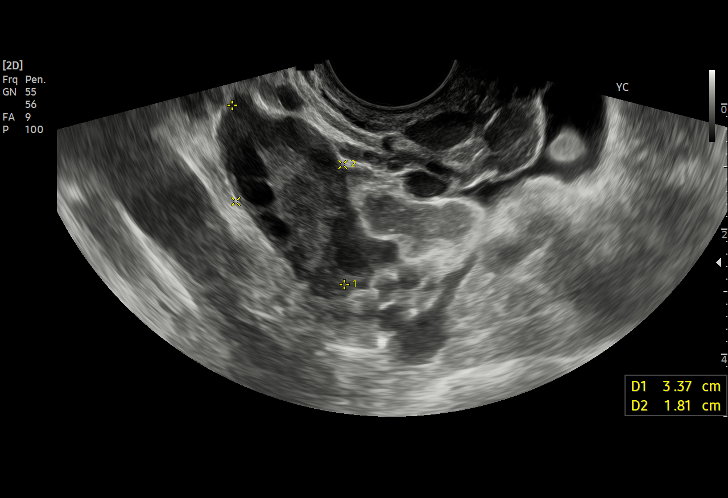
[im 67/74]
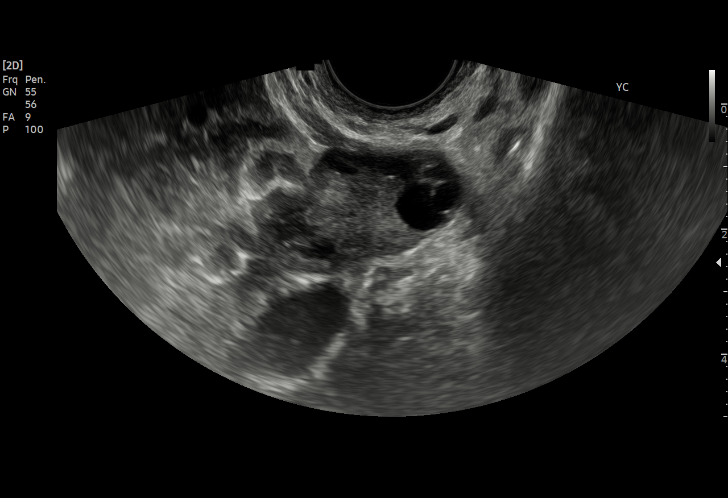
[im 74/74]
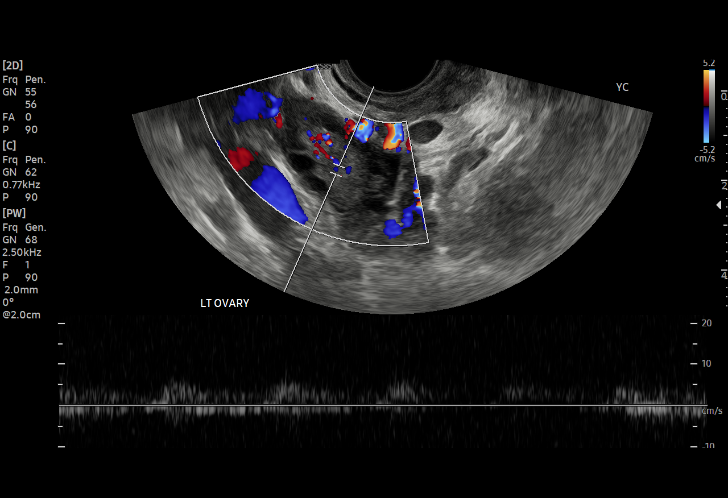

[15 of 25 positions shown; findings below may reference images not displayed]

FINDINGS: Uterus

Measurements: 8 x 4 x 6 cm = volume: 100 mL. No fibroids or other
mass visualized.

Endometrium

Thickness: 9 mm.  No focal abnormality visualized.

Right ovary

Measurements: 43 x 35 x 35 mm = volume: 27 mL. Rounded structure
with accentuated peripheral flow and internal lace-like appearance,
up to 3 cm in diameter.

Left ovary

Measurements: 34 x 18 x 27 mm = volume: 9 mL. Normal appearance/no
adnexal mass.

Pulsed Doppler evaluation of both ovaries demonstrates normal
low-resistance arterial and venous waveforms.

Other findings

Small volume free pelvic fluid which appears simple.
IMPRESSION: 1. Complex cyst in the right ovary most consistent with hemorrhagic
corpus luteum, 3 cm in diameter.
2. Free pelvic fluid
